# Patient Record
Sex: Male | Born: 1970 | Race: White | Hispanic: No | Marital: Married | Smoking: Former smoker
Health system: Southern US, Community
[De-identification: ages and names within clinical notes are randomized; demographics above are authoritative.]

## PROBLEM LIST (undated history)

## (undated) DIAGNOSIS — Z0389 Encounter for observation for other suspected diseases and conditions ruled out: Secondary | ICD-10-CM

## (undated) DIAGNOSIS — C439 Malignant melanoma of skin, unspecified: Secondary | ICD-10-CM

## (undated) DIAGNOSIS — E785 Hyperlipidemia, unspecified: Secondary | ICD-10-CM

## (undated) DIAGNOSIS — G479 Sleep disorder, unspecified: Secondary | ICD-10-CM

## (undated) DIAGNOSIS — R0989 Other specified symptoms and signs involving the circulatory and respiratory systems: Secondary | ICD-10-CM

## (undated) DIAGNOSIS — R0609 Other forms of dyspnea: Secondary | ICD-10-CM

## (undated) DIAGNOSIS — J342 Deviated nasal septum: Secondary | ICD-10-CM

## (undated) DIAGNOSIS — E669 Obesity, unspecified: Secondary | ICD-10-CM

## (undated) HISTORY — DX: Other specified symptoms and signs involving the circulatory and respiratory systems: R06.09

## (undated) HISTORY — DX: Sleep disorder, unspecified: G47.9

## (undated) HISTORY — DX: Encounter for observation for other suspected diseases and conditions ruled out: Z03.89

## (undated) HISTORY — DX: Hyperlipidemia, unspecified: E78.5

## (undated) HISTORY — DX: Obesity, unspecified: E66.9

## (undated) HISTORY — PX: FRACTURE SURGERY: SHX138

## (undated) HISTORY — PX: KNEE SURGERY: SHX244

## (undated) HISTORY — DX: Other specified symptoms and signs involving the circulatory and respiratory systems: R09.89

## (undated) HISTORY — PX: TONSILLECTOMY: SUR1361

---

## 1997-12-23 ENCOUNTER — Encounter: Payer: Self-pay | Admitting: Emergency Medicine

## 1997-12-23 ENCOUNTER — Emergency Department (HOSPITAL_COMMUNITY): Admission: EM | Admit: 1997-12-23 | Discharge: 1997-12-23 | Payer: Self-pay | Admitting: Emergency Medicine

## 1997-12-25 ENCOUNTER — Encounter: Admission: RE | Admit: 1997-12-25 | Discharge: 1998-03-25 | Payer: Self-pay | Admitting: *Deleted

## 2002-11-16 ENCOUNTER — Encounter: Payer: Self-pay | Admitting: Emergency Medicine

## 2002-11-16 ENCOUNTER — Emergency Department (HOSPITAL_COMMUNITY): Admission: EM | Admit: 2002-11-16 | Discharge: 2002-11-16 | Payer: Self-pay | Admitting: Emergency Medicine

## 2004-10-03 ENCOUNTER — Emergency Department: Payer: Self-pay | Admitting: Unknown Physician Specialty

## 2006-03-27 ENCOUNTER — Emergency Department: Payer: Self-pay | Admitting: Emergency Medicine

## 2008-01-21 ENCOUNTER — Emergency Department (HOSPITAL_COMMUNITY): Admission: EM | Admit: 2008-01-21 | Discharge: 2008-01-21 | Payer: Self-pay | Admitting: Emergency Medicine

## 2009-12-01 ENCOUNTER — Ambulatory Visit: Payer: Self-pay | Admitting: Diagnostic Radiology

## 2009-12-01 ENCOUNTER — Emergency Department (HOSPITAL_BASED_OUTPATIENT_CLINIC_OR_DEPARTMENT_OTHER): Admission: EM | Admit: 2009-12-01 | Discharge: 2009-12-01 | Payer: Self-pay | Admitting: Emergency Medicine

## 2010-04-08 ENCOUNTER — Emergency Department (HOSPITAL_BASED_OUTPATIENT_CLINIC_OR_DEPARTMENT_OTHER)
Admission: EM | Admit: 2010-04-08 | Discharge: 2010-04-08 | Disposition: A | Discharge status: Home / Self Care | Payer: Self-pay | Attending: Emergency Medicine | Admitting: Emergency Medicine

## 2010-04-08 DIAGNOSIS — J45909 Unspecified asthma, uncomplicated: Secondary | ICD-10-CM | POA: Insufficient documentation

## 2010-04-08 DIAGNOSIS — G8929 Other chronic pain: Secondary | ICD-10-CM | POA: Insufficient documentation

## 2010-04-08 DIAGNOSIS — J40 Bronchitis, not specified as acute or chronic: Secondary | ICD-10-CM | POA: Insufficient documentation

## 2010-04-08 DIAGNOSIS — R05 Cough: Secondary | ICD-10-CM | POA: Insufficient documentation

## 2010-04-08 DIAGNOSIS — R059 Cough, unspecified: Secondary | ICD-10-CM | POA: Insufficient documentation

## 2010-06-13 ENCOUNTER — Emergency Department (HOSPITAL_BASED_OUTPATIENT_CLINIC_OR_DEPARTMENT_OTHER)
Admission: EM | Admit: 2010-06-13 | Discharge: 2010-06-13 | Disposition: A | Discharge status: Home / Self Care | Payer: Self-pay | Attending: Emergency Medicine | Admitting: Emergency Medicine

## 2010-06-13 DIAGNOSIS — L02419 Cutaneous abscess of limb, unspecified: Secondary | ICD-10-CM | POA: Insufficient documentation

## 2010-06-13 DIAGNOSIS — M549 Dorsalgia, unspecified: Secondary | ICD-10-CM | POA: Insufficient documentation

## 2010-06-13 DIAGNOSIS — J45909 Unspecified asthma, uncomplicated: Secondary | ICD-10-CM | POA: Insufficient documentation

## 2010-06-13 DIAGNOSIS — G8929 Other chronic pain: Secondary | ICD-10-CM | POA: Insufficient documentation

## 2010-06-13 LAB — DIFFERENTIAL
Basophils Absolute: 0 10*3/uL (ref 0.0–0.1)
Basophils Relative: 0 % (ref 0–1)
Eosinophils Absolute: 0.1 10*3/uL (ref 0.0–0.7)
Eosinophils Relative: 1 % (ref 0–5)
Lymphocytes Relative: 26 % (ref 12–46)
Lymphs Abs: 2.4 10*3/uL (ref 0.7–4.0)
Monocytes Absolute: 1.3 10*3/uL — ABNORMAL HIGH (ref 0.1–1.0)
Monocytes Relative: 14 % — ABNORMAL HIGH (ref 3–12)
Neutro Abs: 5.3 10*3/uL (ref 1.7–7.7)
Neutrophils Relative %: 58 % (ref 43–77)

## 2010-06-13 LAB — GLUCOSE, CAPILLARY: Glucose-Capillary: 96 mg/dL (ref 70–99)

## 2010-06-13 LAB — CBC
HCT: 43 % (ref 39.0–52.0)
MCH: 28.7 pg (ref 26.0–34.0)
MCHC: 34.7 g/dL (ref 30.0–36.0)
RDW: 12.7 % (ref 11.5–15.5)

## 2011-03-01 HISTORY — PX: TURBINATE RESECTION: SHX6158

## 2011-03-29 ENCOUNTER — Ambulatory Visit: Payer: BC Managed Care – PPO | Admitting: Physical Medicine & Rehabilitation

## 2011-03-29 ENCOUNTER — Encounter: Payer: BC Managed Care – PPO | Attending: Physical Medicine & Rehabilitation

## 2011-04-15 ENCOUNTER — Ambulatory Visit (HOSPITAL_COMMUNITY)
Admission: RE | Admit: 2011-04-15 | Discharge: 2011-04-15 | Disposition: A | Discharge status: Home / Self Care | Payer: BC Managed Care – PPO | Source: Ambulatory Visit | Attending: Physical Medicine & Rehabilitation | Admitting: Physical Medicine & Rehabilitation

## 2011-04-15 ENCOUNTER — Other Ambulatory Visit: Payer: Self-pay | Admitting: Physical Medicine & Rehabilitation

## 2011-04-15 ENCOUNTER — Encounter: Payer: BC Managed Care – PPO | Attending: Physical Medicine & Rehabilitation

## 2011-04-15 ENCOUNTER — Ambulatory Visit (HOSPITAL_BASED_OUTPATIENT_CLINIC_OR_DEPARTMENT_OTHER): Payer: BC Managed Care – PPO | Admitting: Physical Medicine & Rehabilitation

## 2011-04-15 DIAGNOSIS — M546 Pain in thoracic spine: Secondary | ICD-10-CM

## 2011-04-15 DIAGNOSIS — M25569 Pain in unspecified knee: Secondary | ICD-10-CM | POA: Insufficient documentation

## 2011-04-15 DIAGNOSIS — M549 Dorsalgia, unspecified: Secondary | ICD-10-CM | POA: Insufficient documentation

## 2011-04-15 DIAGNOSIS — R52 Pain, unspecified: Secondary | ICD-10-CM

## 2011-04-15 DIAGNOSIS — G8921 Chronic pain due to trauma: Secondary | ICD-10-CM
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
## 2011-04-20 ENCOUNTER — Telehealth: Payer: Self-pay | Admitting: Physical Medicine & Rehabilitation

## 2011-04-20 NOTE — Telephone Encounter (Signed)
X-rays of the thoracic spine are normal X-rays of the right knee shows some evidence of a chronic ligament tear, nothing new, no joint arthritis

## 2011-04-20 NOTE — Telephone Encounter (Signed)
Pt requesting xray results

## 2011-04-20 NOTE — Telephone Encounter (Signed)
Please look at X-rays so that I may tell the patient what they show. Thanks.

## 2011-04-21 NOTE — Telephone Encounter (Signed)
Pt aware.

## 2011-04-25 ENCOUNTER — Telehealth: Payer: Self-pay | Admitting: Physical Medicine & Rehabilitation

## 2011-04-25 ENCOUNTER — Other Ambulatory Visit: Payer: Self-pay | Admitting: Physical Medicine & Rehabilitation

## 2011-04-25 MED ORDER — TRAMADOL HCL 50 MG PO TABS: 100.0000 mg | ORAL_TABLET | Freq: Three times a day (TID) | ORAL | Status: DC

## 2011-04-25 NOTE — Telephone Encounter (Signed)
Tramadol not working. Please call.

## 2011-04-25 NOTE — Telephone Encounter (Signed)
What dose would you like me to send in? He is on Tramadol 50mg  2 tablets TID

## 2011-04-25 NOTE — Telephone Encounter (Signed)
Please call in higher dose tramadol rx

## 2011-04-25 NOTE — Telephone Encounter (Signed)
Pt states that Tramadol isn't giving him any relief whatsoever. He has an appointment on 05/16/2011 with Dr. Wynn Banker but is wanting something stronger.

## 2011-04-26 NOTE — Telephone Encounter (Signed)
If the pt is already on Tramadol 100mg  TID may add tylenol 650mg  TID to this

## 2011-04-26 NOTE — Telephone Encounter (Signed)
Pt states that he is not going to do this and he will not be back to this office. He will go somewhere that is going to help him.

## 2011-05-10 ENCOUNTER — Encounter: Payer: BC Managed Care – PPO | Attending: Physical Medicine & Rehabilitation

## 2011-05-10 ENCOUNTER — Ambulatory Visit: Payer: BC Managed Care – PPO | Admitting: Physical Medicine & Rehabilitation

## 2011-05-10 DIAGNOSIS — G8921 Chronic pain due to trauma: Secondary | ICD-10-CM | POA: Insufficient documentation

## 2011-05-10 DIAGNOSIS — M25569 Pain in unspecified knee: Secondary | ICD-10-CM | POA: Insufficient documentation

## 2011-05-10 DIAGNOSIS — M546 Pain in thoracic spine: Secondary | ICD-10-CM | POA: Insufficient documentation

## 2011-05-13 ENCOUNTER — Encounter: Payer: Self-pay | Admitting: *Deleted
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
## 2011-06-16 ENCOUNTER — Emergency Department: Payer: Self-pay | Admitting: Emergency Medicine

## 2011-12-27 ENCOUNTER — Emergency Department: Payer: Self-pay | Admitting: Emergency Medicine

## 2012-02-26 ENCOUNTER — Encounter (HOSPITAL_BASED_OUTPATIENT_CLINIC_OR_DEPARTMENT_OTHER): Payer: Self-pay | Admitting: *Deleted

## 2012-02-26 ENCOUNTER — Emergency Department (HOSPITAL_BASED_OUTPATIENT_CLINIC_OR_DEPARTMENT_OTHER)
Admission: EM | Admit: 2012-02-26 | Discharge: 2012-02-27 | Disposition: A | Discharge status: Home / Self Care | Payer: BC Managed Care – PPO | Attending: Emergency Medicine | Admitting: Emergency Medicine

## 2012-02-26 ENCOUNTER — Emergency Department (HOSPITAL_BASED_OUTPATIENT_CLINIC_OR_DEPARTMENT_OTHER): Payer: BC Managed Care – PPO

## 2012-02-26 DIAGNOSIS — S8990XA Unspecified injury of unspecified lower leg, initial encounter: Secondary | ICD-10-CM | POA: Insufficient documentation

## 2012-02-26 DIAGNOSIS — W108XXA Fall (on) (from) other stairs and steps, initial encounter: Secondary | ICD-10-CM | POA: Insufficient documentation

## 2012-02-26 DIAGNOSIS — Z9889 Other specified postprocedural states: Secondary | ICD-10-CM | POA: Insufficient documentation

## 2012-02-26 DIAGNOSIS — Y9389 Activity, other specified: Secondary | ICD-10-CM | POA: Insufficient documentation

## 2012-02-26 DIAGNOSIS — R269 Unspecified abnormalities of gait and mobility: Secondary | ICD-10-CM | POA: Insufficient documentation

## 2012-02-26 DIAGNOSIS — Y929 Unspecified place or not applicable: Secondary | ICD-10-CM | POA: Insufficient documentation

## 2012-02-26 DIAGNOSIS — M25569 Pain in unspecified knee: Secondary | ICD-10-CM

## 2012-02-26 MED ORDER — OXYCODONE-ACETAMINOPHEN 5-325 MG PO TABS
2.0000 | ORAL_TABLET | Freq: Once | ORAL | Status: AC
  Administered 2012-02-26: 2 via ORAL
  Filled 2012-02-26 (×2): qty 2

## 2012-02-26 MED ORDER — OXYCODONE-ACETAMINOPHEN 5-325 MG PO TABS: 1.0000 | ORAL_TABLET | ORAL | Status: DC | PRN

## 2012-02-26 MED ORDER — IBUPROFEN 800 MG PO TABS: 800.0000 mg | ORAL_TABLET | Freq: Three times a day (TID) | ORAL | Status: DC

## 2012-02-26 NOTE — ED Provider Notes (Signed)
History     CSN: [id number]  Arrival date & time 02/26/12  2216   First MD Initiated Contact with Patient 02/26/12 2233      Chief Complaint  Patient presents with  . Knee Pain    (Consider location/radiation/quality/duration/timing/severity/associated sxs/prior treatment) Patient is a 41 y.o. male presenting with knee pain. The history is provided by the patient. No language interpreter was used.  Knee Pain This is a new problem. The current episode started today. The problem occurs constantly. Pertinent negatives include no fever, joint swelling or vomiting. Associated symptoms comments: Right knee pain. The symptoms are aggravated by walking. He has tried nothing for the symptoms.  41yo male with R knee pain and R lower extremity pain after a fall down 8 stairs.  Prior R knee surgery. Walking with a limp.  No deformity or swelling noted.    History reviewed. No pertinent past medical history.  Past Surgical History  Procedure Date  . Knee surgery   . Fracture surgery   . Tonsillectomy     History reviewed. No pertinent family history.  History  Substance Use Topics  . Smoking status: Never Smoker   . Smokeless tobacco: Not on file  . Alcohol Use: No      Review of Systems  Constitutional: Negative.  Negative for fever.  HENT: Negative.   Eyes: Negative.   Respiratory: Negative.   Cardiovascular: Negative.   Gastrointestinal: Negative.  Negative for vomiting.  Musculoskeletal: Positive for gait problem. Negative for joint swelling.       Right knee pain Distal tib fib pain.  Neurological: Negative.   Psychiatric/Behavioral: Negative.   All other systems reviewed and are negative.    Allergies  Vicodin  Home Medications   Current Outpatient Rx  Name  Route  Sig  Dispense  Refill  . DICLOFENAC SODIUM 1 % TD GEL   Topical   Apply topically 4 (four) times daily.         . TRAMADOL HCL 50 MG PO TABS   Oral   Take 2 tablets (100 mg total) by mouth 3  (three) times daily.   180 tablet   1     BP 149/90  Pulse 80  Temp 97.8 F (36.6 C) (Oral)  Resp 20  Ht 6\' 2"  (1.88 m)  Wt 285 lb (129.275 kg)  BMI 36.59 kg/m2  SpO2 97%  Physical Exam  Nursing note and vitals reviewed. Constitutional: He is oriented to person, place, and time. He appears well-developed and well-nourished.  HENT:  Head: Normocephalic.  Eyes: Conjunctivae normal and EOM are normal. Pupils are equal, round, and reactive to light.  Neck: Normal range of motion. Neck supple.  Cardiovascular: Normal rate.   Pulmonary/Chest: Effort normal.  Abdominal: Soft.  Musculoskeletal: Normal range of motion. He exhibits tenderness. He exhibits no edema.       Tenderness to the right knee/ patella. Pain with inversion of the right foot to the left lateral knee. Positive CMS below injury. Para spinal lumbar pain with no bruising  Neurological: He is alert and oriented to person, place, and time.  Skin: Skin is warm and dry.  Psychiatric: He has a normal mood and affect.    ED Course  Procedures (including critical care time)  Labs Reviewed - No data to display No results found.   No diagnosis found.    MDM  Fall tonight with R knee R LE pain.  X-ray unremarkable for fracture reviewed by myself.  Knee immobilizer and crutches provided.  He will follow up with ortho.  Ibuprofen/ percocet RICE.  + CMS below injury.         Remi Haggard, NP 02/27/12 1154

## 2012-02-26 NOTE — ED Notes (Signed)
Pt states he was walking down hardwood steps with wet shoes and fell down about 8 of them. Now c/o pain to right knee, lower leg and back. Hx surgery right knee.

## 2012-02-27 NOTE — Progress Notes (Signed)
Two calls made to patients home number - no answer.  I called cell phone- mail box is full.  I call home number again and left information on voice mail- NPO after midnight, call [phone] at 0800 for arrival time, vallet parking and meds that he can take with a sip of water- Tylox or Ultram.

## 2012-02-28 ENCOUNTER — Encounter (HOSPITAL_COMMUNITY): Payer: Self-pay | Admitting: Anesthesiology

## 2012-02-28 ENCOUNTER — Ambulatory Visit (HOSPITAL_COMMUNITY): Payer: BC Managed Care – PPO | Admitting: Anesthesiology

## 2012-02-28 ENCOUNTER — Other Ambulatory Visit: Payer: Self-pay | Admitting: Otolaryngology

## 2012-02-28 ENCOUNTER — Encounter (HOSPITAL_COMMUNITY)
Admission: RE | Disposition: A | Discharge status: Home / Self Care | Payer: Self-pay | Source: Ambulatory Visit | Attending: Otolaryngology

## 2012-02-28 ENCOUNTER — Encounter (HOSPITAL_COMMUNITY): Payer: Self-pay | Admitting: *Deleted

## 2012-02-28 ENCOUNTER — Ambulatory Visit (HOSPITAL_COMMUNITY)
Admission: RE | Admit: 2012-02-28 | Discharge: 2012-03-01 | Disposition: A | Discharge status: Home / Self Care | Payer: BC Managed Care – PPO | Source: Ambulatory Visit | Attending: Otolaryngology | Admitting: Otolaryngology

## 2012-02-28 DIAGNOSIS — Z9889 Other specified postprocedural states: Secondary | ICD-10-CM

## 2012-02-28 DIAGNOSIS — J342 Deviated nasal septum: Secondary | ICD-10-CM | POA: Insufficient documentation

## 2012-02-28 DIAGNOSIS — J343 Hypertrophy of nasal turbinates: Secondary | ICD-10-CM | POA: Insufficient documentation

## 2012-02-28 DIAGNOSIS — G4733 Obstructive sleep apnea (adult) (pediatric): Secondary | ICD-10-CM | POA: Insufficient documentation

## 2012-02-28 DIAGNOSIS — E669 Obesity, unspecified: Secondary | ICD-10-CM | POA: Insufficient documentation

## 2012-02-28 DIAGNOSIS — R112 Nausea with vomiting, unspecified: Secondary | ICD-10-CM | POA: Insufficient documentation

## 2012-02-28 DIAGNOSIS — J352 Hypertrophy of adenoids: Secondary | ICD-10-CM | POA: Insufficient documentation

## 2012-02-28 HISTORY — PX: ADENOIDECTOMY: SHX5191

## 2012-02-28 HISTORY — DX: Deviated nasal septum: J34.2

## 2012-02-28 HISTORY — PX: NASAL SEPTOPLASTY W/ TURBINOPLASTY: SHX2070

## 2012-02-28 LAB — CBC
HCT: 43.8 % (ref 39.0–52.0)
Hemoglobin: 14.6 g/dL (ref 13.0–17.0)
MCH: 28.6 pg (ref 26.0–34.0)
MCHC: 33.3 g/dL (ref 30.0–36.0)
MCV: 85.7 fL (ref 78.0–100.0)

## 2012-02-28 LAB — SURGICAL PCR SCREEN: MRSA, PCR: NEGATIVE

## 2012-02-28 SURGERY — SEPTOPLASTY, NOSE, WITH NASAL TURBINATE REDUCTION
Anesthesia: General | Site: Throat | Wound class: Dirty or Infected

## 2012-02-28 MED ORDER — MEPERIDINE HCL 25 MG/ML IJ SOLN: 6.2500 mg | INTRAMUSCULAR | Status: DC | PRN

## 2012-02-28 MED ORDER — MUPIROCIN 2 % EX OINT
TOPICAL_OINTMENT | CUTANEOUS | Status: AC
  Filled 2012-02-28: qty 22

## 2012-02-28 MED ORDER — BACITRACIN ZINC 500 UNIT/GM EX OINT
TOPICAL_OINTMENT | CUTANEOUS | Status: DC | PRN
  Administered 2012-02-28: 1 via TOPICAL

## 2012-02-28 MED ORDER — OXYCODONE HCL 5 MG PO TABS: 5.0000 mg | ORAL_TABLET | Freq: Once | ORAL | Status: DC | PRN

## 2012-02-28 MED ORDER — PROPOFOL 10 MG/ML IV BOLUS
INTRAVENOUS | Status: DC | PRN
  Administered 2012-02-28: 270 mg via INTRAVENOUS

## 2012-02-28 MED ORDER — HYDROMORPHONE HCL PF 1 MG/ML IJ SOLN
0.2500 mg | INTRAMUSCULAR | Status: DC | PRN
  Administered 2012-02-28 (×2): 0.5 mg via INTRAVENOUS

## 2012-02-28 MED ORDER — OXYCODONE HCL 5 MG/5ML PO SOLN
5.0000 mg | Freq: Once | ORAL | Status: DC | PRN
Start: 2012-02-28 — End: 2012-02-28

## 2012-02-28 MED ORDER — MIDAZOLAM HCL 2 MG/2ML IJ SOLN: 0.5000 mg | Freq: Once | INTRAMUSCULAR | Status: DC | PRN

## 2012-02-28 MED ORDER — LACTATED RINGERS IV SOLN
INTRAVENOUS | Status: DC | PRN
  Administered 2012-02-28 (×2): via INTRAVENOUS

## 2012-02-28 MED ORDER — OXYCODONE-ACETAMINOPHEN 5-325 MG PO TABS
1.0000 | ORAL_TABLET | ORAL | Status: DC | PRN
  Administered 2012-02-29 – 2012-03-01 (×3): 2 via ORAL
  Filled 2012-02-28 (×3): qty 2

## 2012-02-28 MED ORDER — OXYMETAZOLINE HCL 0.05 % NA SOLN
NASAL | Status: DC | PRN
  Administered 2012-02-28: 2 via NASAL
  Administered 2012-02-28: 1

## 2012-02-28 MED ORDER — ARTIFICIAL TEARS OP OINT
TOPICAL_OINTMENT | OPHTHALMIC | Status: DC | PRN
  Administered 2012-02-28: 1 via OPHTHALMIC

## 2012-02-28 MED ORDER — CEFAZOLIN SODIUM-DEXTROSE 2-3 GM-% IV SOLR
INTRAVENOUS | Status: AC
  Filled 2012-02-28: qty 50

## 2012-02-28 MED ORDER — CEFAZOLIN SODIUM 1-5 GM-% IV SOLN
1.0000 g | Freq: Three times a day (TID) | INTRAVENOUS | Status: AC
  Administered 2012-02-29 (×3): 1 g via INTRAVENOUS
  Filled 2012-02-28 (×3): qty 50

## 2012-02-28 MED ORDER — DEXAMETHASONE SODIUM PHOSPHATE 4 MG/ML IJ SOLN
INTRAMUSCULAR | Status: DC | PRN
  Administered 2012-02-28: 20 mg via INTRAVENOUS

## 2012-02-28 MED ORDER — MUPIROCIN 2 % EX OINT
TOPICAL_OINTMENT | Freq: Two times a day (BID) | CUTANEOUS | Status: DC
  Filled 2012-02-28: qty 22

## 2012-02-28 MED ORDER — MIDAZOLAM HCL 5 MG/5ML IJ SOLN
INTRAMUSCULAR | Status: DC | PRN
  Administered 2012-02-28: 2 mg via INTRAVENOUS

## 2012-02-28 MED ORDER — ROCURONIUM BROMIDE 100 MG/10ML IV SOLN
INTRAVENOUS | Status: DC | PRN
  Administered 2012-02-28: 50 mg via INTRAVENOUS

## 2012-02-28 MED ORDER — PHENOL 1.4 % MT LIQD
1.0000 | OROMUCOSAL | Status: DC | PRN
  Filled 2012-02-28: qty 177

## 2012-02-28 MED ORDER — 0.9 % SODIUM CHLORIDE (POUR BTL) OPTIME
TOPICAL | Status: DC | PRN
  Administered 2012-02-28: 1000 mL

## 2012-02-28 MED ORDER — PROMETHAZINE HCL 25 MG/ML IJ SOLN: 6.2500 mg | INTRAMUSCULAR | Status: DC | PRN

## 2012-02-28 MED ORDER — HYDROMORPHONE HCL PF 1 MG/ML IJ SOLN
INTRAMUSCULAR | Status: AC
  Filled 2012-02-28: qty 1

## 2012-02-28 MED ORDER — PROMETHAZINE HCL 25 MG PO TABS: 25.0000 mg | ORAL_TABLET | Freq: Four times a day (QID) | ORAL | Status: DC | PRN

## 2012-02-28 MED ORDER — ZOLPIDEM TARTRATE 5 MG PO TABS: 5.0000 mg | ORAL_TABLET | Freq: Every evening | ORAL | Status: DC | PRN

## 2012-02-28 MED ORDER — ONDANSETRON HCL 4 MG/2ML IJ SOLN
INTRAMUSCULAR | Status: DC | PRN
  Administered 2012-02-28: 4 mg via INTRAVENOUS

## 2012-02-28 MED ORDER — GLYCOPYRROLATE 0.2 MG/ML IJ SOLN
INTRAMUSCULAR | Status: DC | PRN
  Administered 2012-02-28: .6 mg via INTRAVENOUS

## 2012-02-28 MED ORDER — MORPHINE SULFATE 2 MG/ML IJ SOLN
2.0000 mg | INTRAMUSCULAR | Status: DC | PRN
  Administered 2012-02-28 – 2012-02-29 (×7): 4 mg via INTRAVENOUS
  Filled 2012-02-28: qty 2
  Filled 2012-02-28: qty 1
  Filled 2012-02-28 (×6): qty 2

## 2012-02-28 MED ORDER — CEFAZOLIN SODIUM-DEXTROSE 2-3 GM-% IV SOLR
INTRAVENOUS | Status: DC | PRN
  Administered 2012-02-28: 2 g via INTRAVENOUS

## 2012-02-28 MED ORDER — NEOSTIGMINE METHYLSULFATE 1 MG/ML IJ SOLN
INTRAMUSCULAR | Status: DC | PRN
  Administered 2012-02-28: 4 mg via INTRAVENOUS

## 2012-02-28 MED ORDER — LIDOCAINE-EPINEPHRINE 1 %-1:100000 IJ SOLN
INTRAMUSCULAR | Status: DC | PRN
  Administered 2012-02-28: 4 mL

## 2012-02-28 MED ORDER — PROMETHAZINE HCL 25 MG RE SUPP
25.0000 mg | Freq: Four times a day (QID) | RECTAL | Status: DC | PRN
  Administered 2012-02-29: 25 mg via RECTAL
  Filled 2012-02-28: qty 1

## 2012-02-28 MED ORDER — KCL IN DEXTROSE-NACL 20-5-0.45 MEQ/L-%-% IV SOLN
INTRAVENOUS | Status: DC
  Administered 2012-02-28 – 2012-02-29 (×3): via INTRAVENOUS
  Filled 2012-02-28 (×4): qty 1000

## 2012-02-28 MED ORDER — BACITRACIN ZINC 500 UNIT/GM EX OINT
TOPICAL_OINTMENT | CUTANEOUS | Status: AC
  Filled 2012-02-28: qty 15

## 2012-02-28 MED ORDER — LIDOCAINE-EPINEPHRINE 1 %-1:100000 IJ SOLN
INTRAMUSCULAR | Status: AC
  Filled 2012-02-28: qty 1

## 2012-02-28 MED ORDER — OXYMETAZOLINE HCL 0.05 % NA SOLN
NASAL | Status: AC
  Filled 2012-02-28: qty 30

## 2012-02-28 MED ORDER — LACTATED RINGERS IV SOLN
INTRAVENOUS | Status: DC
  Administered 2012-02-28: 17:00:00 via INTRAVENOUS

## 2012-02-28 MED ORDER — SUCCINYLCHOLINE CHLORIDE 20 MG/ML IJ SOLN
INTRAMUSCULAR | Status: DC | PRN
  Administered 2012-02-28: 120 mg via INTRAVENOUS

## 2012-02-28 MED ORDER — LIDOCAINE HCL (CARDIAC) 20 MG/ML IV SOLN
INTRAVENOUS | Status: DC | PRN
  Administered 2012-02-28: 100 mg via INTRAVENOUS

## 2012-02-28 MED ORDER — FENTANYL CITRATE 0.05 MG/ML IJ SOLN
INTRAMUSCULAR | Status: DC | PRN
  Administered 2012-02-28 (×5): 50 ug via INTRAVENOUS

## 2012-02-28 SURGICAL SUPPLY — 45 items
CANISTER SUCTION 1500CC (MISCELLANEOUS) ×3 IMPLANT
CANISTER SUCTION 2500CC (MISCELLANEOUS) ×3 IMPLANT
CATH ROBINSON RED A/P 10FR (CATHETERS) ×3 IMPLANT
CLEANER TIP ELECTROSURG 2X2 (MISCELLANEOUS) ×1 IMPLANT
CLOTH BEACON ORANGE TIMEOUT ST (SAFETY) ×3 IMPLANT
COAGULATOR SUCT SWTCH 10FR 6 (ELECTROSURGICAL) ×3 IMPLANT
DECANTER SPIKE VIAL GLASS SM (MISCELLANEOUS) ×3 IMPLANT
DRAPE CAMERA CLOSED 9X96 (DRAPES) IMPLANT
ELECT COATED BLADE 2.86 ST (ELECTRODE) IMPLANT
ELECT REM PT RETURN 9FT ADLT (ELECTROSURGICAL) ×3
ELECT REM PT RETURN 9FT PED (ELECTROSURGICAL)
ELECTRODE REM PT RETRN 9FT PED (ELECTROSURGICAL) IMPLANT
ELECTRODE REM PT RTRN 9FT ADLT (ELECTROSURGICAL) IMPLANT
GAUZE SPONGE 2X2 8PLY STRL LF (GAUZE/BANDAGES/DRESSINGS) ×2 IMPLANT
GAUZE SPONGE 4X4 16PLY XRAY LF (GAUZE/BANDAGES/DRESSINGS) ×3 IMPLANT
GLOVE ECLIPSE 7.5 STRL STRAW (GLOVE) ×3 IMPLANT
GOWN STRL NON-REIN LRG LVL3 (GOWN DISPOSABLE) ×6 IMPLANT
KIT BASIN OR (CUSTOM PROCEDURE TRAY) ×3 IMPLANT
KIT ROOM TURNOVER OR (KITS) ×3 IMPLANT
NS IRRIG 1000ML POUR BTL (IV SOLUTION) ×3 IMPLANT
PACK SURGICAL SETUP 50X90 (CUSTOM PROCEDURE TRAY) ×3 IMPLANT
PAD ARMBOARD 7.5X6 YLW CONV (MISCELLANEOUS) ×6 IMPLANT
PENCIL FOOT CONTROL (ELECTRODE) IMPLANT
SPECIMEN JAR SMALL (MISCELLANEOUS) ×1 IMPLANT
SPLINT NASAL DOYLE BI-VL (GAUZE/BANDAGES/DRESSINGS) ×3 IMPLANT
SPONGE GAUZE 2X2 STER 10/PKG (GAUZE/BANDAGES/DRESSINGS)
SPONGE NEURO XRAY DETECT 1X3 (DISPOSABLE) ×3 IMPLANT
SPONGE TONSIL 1 RF SGL (DISPOSABLE) ×4 IMPLANT
SUT CHROMIC 3 0 SH 27 (SUTURE) ×1 IMPLANT
SUT CHROMIC 4 0 S 4 (SUTURE) IMPLANT
SUT PLAIN 4 0 ~~LOC~~ 1 (SUTURE) ×3 IMPLANT
SUT PROLENE 2 0 FS (SUTURE) IMPLANT
SUT VIC AB 3-0 SH 27 (SUTURE)
SUT VIC AB 3-0 SH 27X BRD (SUTURE) IMPLANT
SYR BULB 3OZ (MISCELLANEOUS) ×3 IMPLANT
TOWEL OR 17X24 6PK STRL BLUE (TOWEL DISPOSABLE) ×3 IMPLANT
TOWEL OR 17X26 10 PK STRL BLUE (TOWEL DISPOSABLE) ×3 IMPLANT
TRAY ENT MC OR (CUSTOM PROCEDURE TRAY) ×3 IMPLANT
TUBE CONNECTING 12X1/4 (SUCTIONS) ×3 IMPLANT
TUBE SALEM SUMP 10F W/ARV (TUBING) IMPLANT
TUBE SALEM SUMP 12R W/ARV (TUBING) IMPLANT
TUBE SALEM SUMP 14F W/ARV (TUBING) IMPLANT
TUBE SALEM SUMP 16 FR W/ARV (TUBING) IMPLANT
TUBING EXTENTION W/L.L. (IV SETS) IMPLANT
WATER STERILE IRR 1000ML POUR (IV SOLUTION) ×3 IMPLANT

## 2012-02-28 NOTE — Brief Op Note (Signed)
02/28/2012  7:04 PM  PATIENT:  Richard Byrd  41 y.o. male  PRE-OPERATIVE DIAGNOSIS:  DEVIATED SEPTUM, BILATERAL TURBINATE HYPERTROPHY, AND ADENOID HYPTERTROPHY   POST-OPERATIVE DIAGNOSIS:  DEVIATED SEPTUM, BILATERAL TURBINATE HYPERTROPHY, AND ADENOID HYPTERTROPHY  PROCEDURE:  Procedure(s) (LRB) with comments: 1) NASAL SEPTOPLASTY  2) Bilateral partial inferior TURBINATE resection 3) ADENOIDECTOMY   SURGEON:  Surgeon(s) and Role:    * Darletta Moll, MD - Primary  PHYSICIAN ASSISTANT:   ASSISTANTS: none   ANESTHESIA:   general  EBL:     BLOOD ADMINISTERED:none  DRAINS: none   LOCAL MEDICATIONS USED:  LIDOCAINE   SPECIMEN:  Source of Specimen:  adenoid  DISPOSITION OF SPECIMEN:  PATHOLOGY  COUNTS:  YES  TOURNIQUET:  * No tourniquets in log *  DICTATION: .Other Dictation: Dictation Number  [id number]  PLAN OF CARE: Admit for overnight observation  PATIENT DISPOSITION:  PACU - hemodynamically stable.   Delay start of Pharmacological VTE agent (>24hrs) due to surgical blood loss or risk of bleeding: no

## 2012-02-28 NOTE — ED Provider Notes (Signed)
Medical screening examination/treatment/procedure(s) were performed by non-physician practitioner and as supervising physician I was immediately available for consultation/collaboration.  Perlie Scheuring, MD 02/28/12 0953 

## 2012-02-28 NOTE — Anesthesia Procedure Notes (Addendum)
Procedure Name: Intubation Performed by: Edmonia Caprio   Procedure Name: Intubation Date/Time: 02/28/2012 5:19 PM Performed by: Edmonia Caprio Pre-anesthesia Checklist: Patient identified, Emergency Drugs available, Suction available, Patient being monitored and Timeout performed Patient Re-evaluated:Patient Re-evaluated prior to inductionOxygen Delivery Method: Circle system utilized Preoxygenation: Pre-oxygenation with 100% oxygen Intubation Type: IV induction and Rapid sequence Ventilation: Mask ventilation without difficulty Grade View: Grade III Tube type: Oral Tube size: 7.5 mm Number of attempts: 2 Airway Equipment and Method: Patient positioned with wedge pillow,  Video-laryngoscopy and Stylet Placement Confirmation: ETT inserted through vocal cords under direct vision,  breath sounds checked- equal and bilateral and positive ETCO2 Secured at: 22 cm Dental Injury: Teeth and Oropharynx as per pre-operative assessment  Difficulty Due To: Difficulty was anticipated and Difficult Airway- due to anterior larynx

## 2012-02-28 NOTE — Preoperative (Signed)
Beta Blockers   Reason not to administer Beta Blockers:Not Applicable 

## 2012-02-28 NOTE — Transfer of Care (Signed)
Immediate Anesthesia Transfer of Care Note  Patient: Richard Byrd  Procedure(s) Performed: Procedure(s) (LRB) with comments: NASAL SEPTOPLASTY WITH TURBINATE REDUCTION (Bilateral) - SEPTOPLASTY AND BILATERAL TURBINATE RESECTION   ADENOIDECTOMY (N/A)  Patient Location: PACU  Anesthesia Type:General  Level of Consciousness: awake and alert   Airway & Oxygen Therapy: Patient Spontanous Breathing and Patient connected to face mask  Post-op Assessment: Report given to PACU RN  Post vital signs: Reviewed  Complications: No apparent anesthesia complications
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
## 2012-02-28 NOTE — Anesthesia Postprocedure Evaluation (Signed)
  Anesthesia Post-op Note  Patient: Richard Byrd  Procedure(s) Performed: Procedure(s) (LRB) with comments: NASAL SEPTOPLASTY WITH TURBINATE REDUCTION (Bilateral) - SEPTOPLASTY AND BILATERAL TURBINATE RESECTION   ADENOIDECTOMY (N/A)  Patient Location: PACU  Anesthesia Type:General  Level of Consciousness: awake, alert , oriented and patient cooperative  Airway and Oxygen Therapy: Patient Spontanous Breathing  Post-op Pain: mild  Post-op Assessment: Post-op Vital signs reviewed, Patient's Cardiovascular Status Stable, Respiratory Function Stable, Patent Airway, No signs of Nausea or vomiting and Pain level controlled  Post-op Vital Signs: Reviewed and stable  Complications: No apparent anesthesia complications

## 2012-02-28 NOTE — Anesthesia Preprocedure Evaluation (Addendum)
Anesthesia Evaluation  Patient identified by MRN, date of birth, ID band Patient awake    Reviewed: Allergy & Precautions, H&P , Patient's Chart, lab work & pertinent test results  Airway Mallampati: III TM Distance: >3 FB Neck ROM: Full    Dental  (+) Dental Advisory Given   Pulmonary sleep apnea (no sleep study but patient thinks he has sleep apnea +snoring) ,  breath sounds clear to auscultation        Cardiovascular negative cardio ROS  Rhythm:Regular Rate:Normal     Neuro/Psych negative neurological ROS     GI/Hepatic negative GI ROS, Neg liver ROS,   Endo/Other  negative endocrine ROS  Renal/GU negative Renal ROS  negative genitourinary   Musculoskeletal negative musculoskeletal ROS (+)   Abdominal (+) + obese,   Peds  Hematology negative hematology ROS (+)   Anesthesia Other Findings Patient with ER visit 2 days ago s/p Larey Seat and hurt his knee.  On Ibuprofen 800 mg, stopped 24 hours ago prior to surgery.  Knee feeling better now.  Reproductive/Obstetrics negative OB ROS                          Anesthesia Physical Anesthesia Plan  ASA: II  Anesthesia Plan: General   Post-op Pain Management:    Induction: Intravenous  Airway Management Planned: Oral ETT  Additional Equipment:   Intra-op Plan:   Post-operative Plan: Extubation in OR  Informed Consent: I have reviewed the patients History and Physical, chart, labs and discussed the procedure including the risks, benefits and alternatives for the proposed anesthesia with the patient or authorized representative who has indicated his/her understanding and acceptance.   Dental advisory given  Plan Discussed with: CRNA and Surgeon  Anesthesia Plan Comments:         Anesthesia Quick Evaluation

## 2012-02-29 ENCOUNTER — Encounter (HOSPITAL_COMMUNITY): Payer: Self-pay | Admitting: General Practice

## 2012-02-29 MED ORDER — INFLUENZA VIRUS VACC SPLIT PF IM SUSP
0.5000 mL | INTRAMUSCULAR | Status: AC
  Administered 2012-03-01: 0.5 mL via INTRAMUSCULAR
  Filled 2012-02-29: qty 0.5

## 2012-02-29 NOTE — Op Note (Signed)
NAMEDMARI, SCHUBRING              ACCOUNT NO.:  [id number]  MEDICAL RECORD NO.:  [id number]  LOCATION:  6N28C                        FACILITY:  [facility]  PHYSICIAN:  Newman Pies, MD            DATE OF BIRTH:  Oct 27, 1970  DATE OF PROCEDURE:  02/28/2012 DATE OF DISCHARGE:                              OPERATIVE REPORT   SURGEON:  Newman Pies, MD  PREOPERATIVE DIAGNOSES: 1. Bidirectional nasal septal deviation. 2. Bilateral inferior turbinate hypertrophy. 3. Severe adenoid hypertrophy.  POSTOPERATIVE DIAGNOSES: 1. Bidirectional nasal septal deviation. 2. Bilateral inferior turbinate hypertrophy. 3. Severe adenoid hypertrophy.  PROCEDURES PERFORMED: 1. Nasal septoplasty. 2. Bilateral partial inferior turbinate resection. 3. Adenoidectomy.  ANESTHESIA:  General endotracheal tube anesthesia.  COMPLICATIONS:  None.  ESTIMATED BLOOD LOSS:  Less than 100 mL.  INDICATIONS FOR PROCEDURE:  The patient is a 42 year old male with a long history of chronic nasal obstruction.  He was previously noted to have significant nasal mucosal congestion with bidirectional nasal septal deviation and inferior turbinate hypertrophy.  In addition, he was also noted to have severe adenoid hypertrophy on nasal endoscopy examination.  He was previously treated medically with steroid nasal sprays, decongestant, antihistamine, and over-the-counter medications. However, he continues to be symptomatic.  Over the past year, the symptoms have progressively worsened.  Based on the above findings, the decision was made for the patient to undergo the above-stated procedures.  The risks, benefits, alternatives, and details of the procedures were discussed with the patient.  Questions were invited and answered.  Informed consent was obtained.  DESCRIPTION:  The patient was taken to the operating room and placed supine on the operating table.  General endotracheal tube anesthesia was administered by the anesthesiologist.   The patient was positioned and prepped and draped in a standard fashion for adenoidectomy.  Preop IV antibiotics and Decadron were given.  The Crowe-Davis mouth gag was inserted into the oral cavity for exposure.  No significant tonsillar tissue was noted.  However, the patient was noted to have a severely enlarged uvula.  Red rubber catheter was inserted via the left nostril and was used to gently retract the soft palate.  However, due to the large size of the uvula, the adenoid bed could not be fully visualized. As a result, the tip of the uvula was amputated in order to provide access to the adenoid bed.  The patient was noted to have a severely enlarged adenoids.  The adenoid was then removed with an electric cut adenotome.  The adenoid specimen was sent to the Pathology Department for histologic identification.  Hemostasis of the adenoid bed was achieved with suction electrocautery.  The red rubber catheter and mouth gag were then removed.  The patient was then repositioned and prepped and draped in a standard fashion for nasal surgery.  Pledgets soaked with Afrin were placed in both nasal cavities for vasoconstriction.  The pledgets were subsequently removed.  A 1% lidocaine with 1:100,000 epinephrine was injected onto the nasal septum bilaterally.  Examination of the nasal cavity reveals bidirectional nasal septal deviation, obstructing more than 90% of the nasal cavity.  A standard hemitransfixion incision was made on the left  side.  The submucosal flap was elevated on the left side in a standard fashion.  A cartilaginous incision was made 1 cm superior to the caudal margin of the nasal septum.  The contralateral mucosal flap was also elevated in a standard fashion.  The deviated portion of the cartilage and bony septum were removed.  The cartilage was morselized and replaced.  The septum was then quilted with 4-0 plain gut sutures.  The hemitransfixion incision was closed with  interrupted 4- 0 chromic sutures.  Attention was then focused on the inferior turbinates.  The patient was noted to have a severely hypertrophied inferior turbinates.  The inferior one-half of each inferior turbinate was then cross clamped with a pair of straight Kelly clamp.  The inferior one-half of each inferior turbinate was then resected with a pair of cross cutting scissors. Hemostasis was achieved with suction electrocautery.  That concluded procedure for the patient.  The care of the patient was turned over to the anesthesiologist.  The patient was awakened from anesthesia without difficulty.  He was extubated and transferred to the recovery room in good condition.  OPERATIVE FINDINGS: 1. Severe adenoid hypertrophy. 2. Bidirectional nasal septal deviation. 3. Bilateral inferior turbinate hypertrophy.  SPECIMEN:  Adenoid tissue.  FOLLOWUP CARE:  The patient will be admitted for overnight observation. He will most likely be discharged home on postop day #1.     Newman Pies, MD     ST/MEDQ  D:  02/28/2012  T:  02/29/2012  Job:  [id number]

## 2012-02-29 NOTE — Progress Notes (Signed)
Subjective: Pt reports significant nausea.  Vomited earlier this PM.  Does not feel like he is ready to go home. No significant bleeding.  Tolerated some liquid.  Objective: Vital signs in last 24 hours: Temp:  [97.2 F (36.2 C)-98.4 F (36.9 C)] 98.4 F (36.9 C) (01/01 1822) Pulse Rate:  [63-85] 63  (01/01 1822) Resp:  [15-19] 18  (01/01 1822) BP: (118-159)/(63-92) 140/85 mmHg (01/01 1822) SpO2:  [93 %-100 %] 100 % (01/01 1822) Weight:  [137.485 kg (303 lb 1.6 oz)] 137.485 kg (303 lb 1.6 oz) (12/31 2031)  Nasal splints in place.   No bleeding. OP: Mucosal edema. No bleeding. No stridor.   Basename 02/28/12 1511  WBC 5.2  HGB 14.6  HCT 43.8  PLT 193   No results found for this basename: NA:2,K:2,CL:2,CO2:2,GLUCOSE:2,BUN:2,CREATININE:2,CALCIUM:2 in the last 72 hours  Medications:  I have reviewed the patient's current medications. Scheduled:   . influenza  inactive virus vaccine  0.5 mL Intramuscular Tomorrow-1000   UJW:[id number] injection, oxyCODONE-acetaminophen, phenol, promethazine, promethazine, zolpidem  Assessment/Plan: Pt reports significant nausea and discomfort POD#1 s/p septoplasty, turbinate reduction, and adenoidectomy. Pt is obese with obstructive sleep apnea. - Will keep pt for one more night.  - Pain and nausea control prn. - SCDs   LOS: 1 day   Labresha Mellor,SUI W 02/29/2012, 7:54 PM

## 2012-03-01 ENCOUNTER — Encounter (HOSPITAL_COMMUNITY): Payer: Self-pay | Admitting: Otolaryngology

## 2012-03-01 MED ORDER — PROMETHAZINE HCL 25 MG PO TABS: 25.0000 mg | ORAL_TABLET | Freq: Four times a day (QID) | ORAL | Status: DC | PRN

## 2012-03-01 MED ORDER — OXYCODONE-ACETAMINOPHEN 5-325 MG PO TABS: 1.0000 | ORAL_TABLET | ORAL | Status: DC | PRN

## 2012-03-01 NOTE — Discharge Summary (Signed)
Physician Discharge Summary  Patient ID: Richard Byrd MRN: [id number] DOB/AGE: 1970-08-24 42 y.o.  Admit date: 02/28/2012 Discharge date: 03/01/2012  Admission Diagnoses: Nasal septal deviation, bilateral inferior turbinate hypertrophy, adenoid hypertrophy, obstructive sleep apnea, obesity.  Discharge Diagnoses: Nasal septal deviation, bilateral inferior turbinate hypertrophy, adenoid hypertrophy, obstructive sleep apnea, obesity. Active Problems:  * No active hospital problems. *    Discharged Condition: good  Hospital Course: The patient complains of significant nausea and vomiting on postop day #1. He was treated with Phenergan when necessary. His pain was well-controlled with Percocet and morphine. No significant bleeding was noted after the surgery. He was discharged home on postop day #2 in good condition.  Consults: None  Significant Diagnostic Studies: None  Treatments: surgery: Septoplasty, bilateral partial inferior turbinate resection, adenoidectomy  Discharge Exam: Blood pressure 106/58, pulse 77, temperature 97.6 F (36.4 C), temperature source Oral, resp. rate 18, height 6\' 2"  (1.88 m), weight 137.485 kg (303 lb 1.6 oz), SpO2 97.00%. The patient was alert and oriented x3. No significant bleeding was noted from his nasal cavity or the oropharynx. No stridor.  Disposition: 01-Home or Self Care  Discharge Orders    Future Orders Please Complete By Expires   Diet general      Activity as tolerated - No restrictions          Medication List     As of 03/01/2012  8:41 AM    TAKE these medications         oxyCODONE-acetaminophen 5-325 MG per tablet   Commonly known as: PERCOCET/ROXICET   Take 1-2 tablets by mouth every 4 (four) hours as needed.      promethazine 25 MG tablet   Commonly known as: PHENERGAN   Take 1 tablet (25 mg total) by mouth every 6 (six) hours as needed for nausea.           Follow-up Information    Follow up with Darletta Moll, MD. On  03/02/2012. (2:10pm)    Contact information:   [address] [place] [address] [phone]          Signed: Darletta Moll 03/01/2012, 8:41 AM

## 2012-03-01 NOTE — Progress Notes (Signed)
Discharge home.

## 2012-03-25 ENCOUNTER — Emergency Department (HOSPITAL_BASED_OUTPATIENT_CLINIC_OR_DEPARTMENT_OTHER)
Admission: EM | Admit: 2012-03-25 | Discharge: 2012-03-25 | Disposition: A | Discharge status: Home / Self Care | Payer: BC Managed Care – PPO | Attending: Emergency Medicine | Admitting: Emergency Medicine

## 2012-03-25 ENCOUNTER — Emergency Department (HOSPITAL_BASED_OUTPATIENT_CLINIC_OR_DEPARTMENT_OTHER): Payer: BC Managed Care – PPO

## 2012-03-25 ENCOUNTER — Encounter (HOSPITAL_BASED_OUTPATIENT_CLINIC_OR_DEPARTMENT_OTHER): Payer: Self-pay

## 2012-03-25 DIAGNOSIS — Z87828 Personal history of other (healed) physical injury and trauma: Secondary | ICD-10-CM | POA: Insufficient documentation

## 2012-03-25 DIAGNOSIS — Z79899 Other long term (current) drug therapy: Secondary | ICD-10-CM | POA: Insufficient documentation

## 2012-03-25 DIAGNOSIS — Z87891 Personal history of nicotine dependence: Secondary | ICD-10-CM | POA: Insufficient documentation

## 2012-03-25 DIAGNOSIS — R296 Repeated falls: Secondary | ICD-10-CM | POA: Insufficient documentation

## 2012-03-25 DIAGNOSIS — Y92009 Unspecified place in unspecified non-institutional (private) residence as the place of occurrence of the external cause: Secondary | ICD-10-CM | POA: Insufficient documentation

## 2012-03-25 DIAGNOSIS — X500XXA Overexertion from strenuous movement or load, initial encounter: Secondary | ICD-10-CM | POA: Insufficient documentation

## 2012-03-25 DIAGNOSIS — Z8709 Personal history of other diseases of the respiratory system: Secondary | ICD-10-CM | POA: Insufficient documentation

## 2012-03-25 DIAGNOSIS — S93409A Sprain of unspecified ligament of unspecified ankle, initial encounter: Secondary | ICD-10-CM | POA: Insufficient documentation

## 2012-03-25 DIAGNOSIS — Y9389 Activity, other specified: Secondary | ICD-10-CM | POA: Insufficient documentation

## 2012-03-25 MED ORDER — OXYCODONE-ACETAMINOPHEN 5-325 MG PO TABS
1.0000 | ORAL_TABLET | Freq: Once | ORAL | Status: AC
  Administered 2012-03-25: 1 via ORAL
  Filled 2012-03-25 (×2): qty 1

## 2012-03-25 MED ORDER — ONDANSETRON 4 MG PO TBDP
4.0000 mg | ORAL_TABLET | Freq: Once | ORAL | Status: AC
  Administered 2012-03-25: 4 mg via ORAL
  Filled 2012-03-25: qty 1

## 2012-03-25 MED ORDER — NAPROXEN 375 MG PO TABS: 375.0000 mg | ORAL_TABLET | Freq: Two times a day (BID) | ORAL | Status: DC

## 2012-03-25 MED ORDER — OXYCODONE-ACETAMINOPHEN 5-325 MG PO TABS: 1.0000 | ORAL_TABLET | ORAL | Status: DC | PRN

## 2012-03-25 MED ORDER — OSELTAMIVIR PHOSPHATE 75 MG PO CAPS: 75.0000 mg | ORAL_CAPSULE | Freq: Two times a day (BID) | ORAL | Status: DC

## 2012-03-25 MED ORDER — HYDROCODONE-HOMATROPINE 5-1.5 MG/5ML PO SYRP: 5.0000 mL | ORAL_SOLUTION | Freq: Four times a day (QID) | ORAL | Status: DC | PRN

## 2012-03-25 NOTE — ED Provider Notes (Signed)
History     CSN: [id number]  Arrival date & time 03/25/12  1743   First MD Initiated Contact with Patient 03/25/12 2017      Chief Complaint  Patient presents with  . Ankle Injury    (Consider location/radiation/quality/duration/timing/severity/associated sxs/prior treatment) HPI Comments: Richard Byrd is a 42 y.o. male that presents emergency department after twisting his ankle via a mechanical fall.  Patient states that he has damaged his ankle multiple times before in the past.  He states pain is worsened with weightbearing and came in with his own crutches.  Patient heard a loud pop at time of incident.  He denies numbness tingling or weakness of extremity.  In addition patient states that his family members have been diagnosed with a positive flu test.  Patient states that he did vaccine however he started developing symptoms today.  Patient requests Tamiflu prescription.  Patient is a 42 y.o. male presenting with lower extremity injury. The history is provided by the patient.  Ankle Injury    Past Medical History  Diagnosis Date  . Deviated septum     Past Surgical History  Procedure Date  . Knee surgery   . Fracture surgery   . Tonsillectomy   . Turbinate resection 03/01/2011  . Nasal septoplasty w/ turbinoplasty 02/28/2012    Procedure: NASAL SEPTOPLASTY WITH TURBINATE REDUCTION;  Surgeon: Darletta Moll, MD;  Location: [facility] OR;  Service: ENT;  Laterality: Bilateral;  SEPTOPLASTY AND BILATERAL TURBINATE RESECTION    . Adenoidectomy 02/28/2012    Procedure: ADENOIDECTOMY;  Surgeon: Darletta Moll, MD;  Location: [facility] OR;  Service: ENT;  Laterality: N/A;    History reviewed. No pertinent family history.  History  Substance Use Topics  . Smoking status: Former Smoker    Quit date: 02/29/1996  . Smokeless tobacco: Never Used  . Alcohol Use: Yes     Comment: OCCASIONAL      Review of Systems  All other systems reviewed and are negative.    Allergies  Tramadol and  Vicodin  Home Medications   Current Outpatient Rx  Name  Route  Sig  Dispense  Refill  . OXYCODONE-ACETAMINOPHEN 5-325 MG PO TABS   Oral   Take 1 tablet by mouth 2 (two) times daily.         Marland Kitchen PROMETHAZINE HCL 25 MG PO TABS   Oral   Take 1 tablet (25 mg total) by mouth every 6 (six) hours as needed for nausea.   12 tablet   1     BP 154/77  Pulse 101  Temp 98.3 F (36.8 C) (Oral)  Resp 22  Ht 6\' 2"  (1.88 m)  Wt 285 lb (129.275 kg)  BMI 36.59 kg/m2  SpO2 95%  Physical Exam  Nursing note and vitals reviewed. Constitutional: He appears well-developed and well-nourished. No distress.  HENT:  Head: Normocephalic and atraumatic.  Eyes: Conjunctivae normal and EOM are normal.  Neck: Normal range of motion. Neck supple.  Cardiovascular:       Intact distal pulses, capillary refill < 3 seconds  Pulmonary/Chest:       LCAB  Musculoskeletal:       Lateral malleolus swelling and tenderness to palpation.  Pain with inversion and eversion.  Patient unable to perform her range of motion do to pain All other extremities with normal ROM  Neurological:       No sensory deficit  Skin: He is not diaphoretic.       Skin intact,  no tenting    ED Course  Procedures (including critical care time)  Labs Reviewed - No data to display Dg Ankle Complete Right  03/25/2012  *RADIOLOGY REPORT*  Clinical Data: Right ankle pain and swelling following fall.  RIGHT ANKLE - COMPLETE 3+ VIEW  Comparison: 02/26/2012  Findings: There is no evidence of acute fracture, subluxation or dislocation. The ankle mortise is intact. Lateral soft tissue swelling is present. Mild degenerative changes at the tibiotalar joint noted. No focal bony lesions are identified.  IMPRESSION: Soft tissue swelling without evidence of acute bony abnormality.   Original Report Authenticated By: Harmon Pier, M.D.      No diagnosis found.    MDM  Ankle sprain Patient X-Ray negative for obvious fracture or dislocation.  Pain managed in ED. Pt advised to follow up with orthopedics if symptoms persist for possibility of missed fracture diagnosis. Patient given brace while in ED, conservative therapy recommended and discussed. Patient will be dc home & is agreeable with above plan.         Jaci Carrel, [place] 03/25/12 2105

## 2012-03-25 NOTE — ED Provider Notes (Signed)
Medical screening examination/treatment/procedure(s) were performed by non-physician practitioner and as supervising physician I was immediately available for consultation/collaboration.   Rolan Bucco, MD 03/25/12 2242

## 2012-03-25 NOTE — ED Notes (Signed)
Pt states that he fell at home and fell twisting his ankle inward and heard a loud pop.  Pt presents to triage with obvious swelling and bruising to R ankle.  Pt ambulating with one crutch.

## 2012-03-25 NOTE — ED Notes (Signed)
PA aware by this RN that pt. Has an allergy to hydrocodone. Instructed pt.to use OTC meds for cough. Pt. Requested pain meds (percocet) for pain. Pt. States he just started a pain management program. PA aware of this. Pt. States pain management MD is aware that he is here and may be prescribed percocet.

## 2012-03-28 NOTE — ED Provider Notes (Signed)
Medical screening examination/treatment/procedure(s) were performed by non-physician practitioner and as supervising physician I was immediately available for consultation/collaboration.   Michael Ventresca L Raymonde Hamblin, MD 03/28/12 1810 

## 2012-03-28 NOTE — ED Provider Notes (Signed)
Received a phone call from pharmacist Marijo Conception of [facility] in [place] on [address] he is concerned because the patient has multiple narcotic pain medication refills in the [place] [facility] system. He also states that he is a patient of pain management doctor.   Review of the [facility] database shows multiple prescriptions for narcotic pain medications from multiple providers. Advised the pharmacist not to fill his prescription and change prescription to naproxen 500 mg twice a day dispense 10.   Wynetta Emery, PA-C 03/28/12 [phone]

## 2012-06-12 ENCOUNTER — Institutional Professional Consult (permissible substitution): Payer: BC Managed Care – PPO | Admitting: Pulmonary Disease

## 2013-09-11 ENCOUNTER — Emergency Department (HOSPITAL_COMMUNITY)
Admission: EM | Admit: 2013-09-11 | Discharge: 2013-09-11 | Disposition: A | Discharge status: Home / Self Care | Payer: Medicaid Other | Attending: Emergency Medicine | Admitting: Emergency Medicine

## 2013-09-11 ENCOUNTER — Encounter (HOSPITAL_COMMUNITY): Payer: Self-pay | Admitting: Emergency Medicine

## 2013-09-11 DIAGNOSIS — Z8709 Personal history of other diseases of the respiratory system: Secondary | ICD-10-CM | POA: Insufficient documentation

## 2013-09-11 DIAGNOSIS — Z791 Long term (current) use of non-steroidal anti-inflammatories (NSAID): Secondary | ICD-10-CM | POA: Insufficient documentation

## 2013-09-11 DIAGNOSIS — R21 Rash and other nonspecific skin eruption: Secondary | ICD-10-CM | POA: Diagnosis present

## 2013-09-11 DIAGNOSIS — L255 Unspecified contact dermatitis due to plants, except food: Secondary | ICD-10-CM | POA: Insufficient documentation

## 2013-09-11 DIAGNOSIS — Z87891 Personal history of nicotine dependence: Secondary | ICD-10-CM | POA: Diagnosis not present

## 2013-09-11 DIAGNOSIS — Z79899 Other long term (current) drug therapy: Secondary | ICD-10-CM | POA: Diagnosis not present

## 2013-09-11 MED ORDER — METHYLPREDNISOLONE SODIUM SUCC 125 MG IJ SOLR
125.0000 mg | Freq: Once | INTRAMUSCULAR | Status: AC
  Administered 2013-09-11: 125 mg via INTRAVENOUS
  Filled 2013-09-11: qty 2

## 2013-09-11 MED ORDER — FAMOTIDINE 20 MG PO TABS
20.0000 mg | ORAL_TABLET | Freq: Once | ORAL | Status: AC
  Administered 2013-09-11: 20 mg via ORAL
  Filled 2013-09-11: qty 1

## 2013-09-11 MED ORDER — PREDNISONE 10 MG PO TABS: ORAL_TABLET | ORAL | Status: DC

## 2013-09-11 MED ORDER — FAMOTIDINE 20 MG PO TABS: 20.0000 mg | ORAL_TABLET | Freq: Two times a day (BID) | ORAL | Status: DC

## 2013-09-11 MED ORDER — HYDROXYZINE HCL 25 MG PO TABS: 25.0000 mg | ORAL_TABLET | Freq: Four times a day (QID) | ORAL | Status: DC

## 2013-09-11 NOTE — ED Notes (Signed)
Pt. reports progressing itchy hives / rashes at arms , torso , face and neck onset Sunday unrelieved by OTC Caladryl and generic antihistamines. Respirations unlabored / airway intact .

## 2013-09-11 NOTE — Discharge Instructions (Signed)
Continue vistaril, pepcid, take prednisone for your symptoms. Follow up with your doctor. Return if not improving.    Contact Dermatitis Contact dermatitis is a reaction to certain substances that touch the skin. Contact dermatitis can be either irritant contact dermatitis or allergic contact dermatitis. Irritant contact dermatitis does not require previous exposure to the substance for a reaction to occur.Allergic contact dermatitis only occurs if you have been exposed to the substance before. Upon a repeat exposure, your body reacts to the substance.  CAUSES  Many substances can cause contact dermatitis. Irritant dermatitis is most commonly caused by repeated exposure to mildly irritating substances, such as:  Makeup.  Soaps.  Detergents.  Bleaches.  Acids.  Metal salts, such as nickel. Allergic contact dermatitis is most commonly caused by exposure to:  Poisonous plants.  Chemicals (deodorants, shampoos).  Jewelry.  Latex.  Neomycin in triple antibiotic cream.  Preservatives in products, including clothing. SYMPTOMS  The area of skin that is exposed may develop:  Dryness or flaking.  Redness.  Cracks.  Itching.  Pain or a burning sensation.  Blisters. With allergic contact dermatitis, there may also be swelling in areas such as the eyelids, mouth, or genitals.  DIAGNOSIS  Your caregiver can usually tell what the problem is by doing a physical exam. In cases where the cause is uncertain and an allergic contact dermatitis is suspected, a patch skin test may be performed to help determine the cause of your dermatitis. TREATMENT Treatment includes protecting the skin from further contact with the irritating substance by avoiding that substance if possible. Barrier creams, powders, and gloves may be helpful. Your caregiver may also recommend:  Steroid creams or ointments applied 2 times daily. For best results, soak the rash area in cool water for 20 minutes. Then  apply the medicine. Cover the area with a plastic wrap. You can store the steroid cream in the refrigerator for a "chilly" effect on your rash. That may decrease itching. Oral steroid medicines may be needed in more severe cases.  Antibiotics or antibacterial ointments if a skin infection is present.  Antihistamine lotion or an antihistamine taken by mouth to ease itching.  Lubricants to keep moisture in your skin.  Burow's solution to reduce redness and soreness or to dry a weeping rash. Mix one packet or tablet of solution in 2 cups cool water. Dip a clean washcloth in the mixture, wring it out a bit, and put it on the affected area. Leave the cloth in place for 30 minutes. Do this as often as possible throughout the day.  Taking several cornstarch or baking soda baths daily if the area is too large to cover with a washcloth. Harsh chemicals, such as alkalis or acids, can cause skin damage that is like a burn. You should flush your skin for 15 to 20 minutes with cold water after such an exposure. You should also seek immediate medical care after exposure. Bandages (dressings), antibiotics, and pain medicine may be needed for severely irritated skin.  HOME CARE INSTRUCTIONS  Avoid the substance that caused your reaction.  Keep the area of skin that is affected away from hot water, soap, sunlight, chemicals, acidic substances, or anything else that would irritate your skin.  Do not scratch the rash. Scratching may cause the rash to become infected.  You may take cool baths to help stop the itching.  Only take over-the-counter or prescription medicines as directed by your caregiver.  See your caregiver for follow-up care as directed to  make sure your skin is healing properly. SEEK MEDICAL CARE IF:   Your condition is not better after 3 days of treatment.  You seem to be getting worse.  You see signs of infection such as swelling, tenderness, redness, soreness, or warmth in the affected  area.  You have any problems related to your medicines. Document Released: 02/12/2000 Document Revised: 05/09/2011 Document Reviewed: 07/20/2010 [facility] Patient Information 2015 [place], [place]. This information is not intended to replace advice given to you by your health care provider. Make sure you discuss any questions you have with your health care provider.  Poison [facility] is an inflammation of the skin (contact dermatitis). It is caused by contact with the allergens on the leaves of the oak (toxicodendron) plants. Depending on your sensitivity, the rash may consist simply of redness and itching, or it may also progress to blisters which may break open (rupture). These must be well cared for to prevent secondary germ (bacterial) infection as these infections can lead to scarring. The eyes may also get puffy. The puffiness is worst in the morning and gets better as the day progresses. Healing is best accomplished by keeping any open areas dry, clean, covered with a bandage, and covered with an antibacterial ointment if needed. Without secondary infection, this dermatitis usually heals without scarring within 2 to 3 weeks without treatment. HOME CARE INSTRUCTIONS When you have been exposed to poison oak, it is very important to thoroughly wash with soap and water as soon as the exposure has been discovered. You have about one half hour to remove the plant resin before it will cause the rash. This cleaning will quickly destroy the oil or antigen on the skin (the antigen is what causes the rash). Wash aggressively under the fingernails as any plant resin still there will continue to spread the rash. Do not rub skin vigorously when washing affected area. Poison oak cannot spread if no oil from the plant remains on your body. Rash that has progressed to weeping sores (lesions) will not spread the rash unless you have not washed thoroughly. It is also important to clean any clothes you have been  wearing as they may carry active allergens which will spread the rash, even several days later. Avoidance of the plant in the future is the best measure. Poison oak plants can be recognized by the number of leaves. Generally, poison oak has three leaves with flowering branches on a single stem. Diphenhydramine may be purchased over the counter and used as needed for itching. Do not drive with this medication if it makes you drowsy. Ask your caregiver about medication for children. SEEK IMMEDIATE MEDICAL CARE IF:   Open areas of the rash develop.  You notice redness extending beyond the area of the rash.  There is a pus like discharge.  There is increased pain.  Other signs of infection develop (such as fever). Document Released: 08/21/2002 Document Revised: 05/09/2011 Document Reviewed: 12/31/2008 [facility] Patient Information 2015 [place], [place]. This information is not intended to replace advice given to you by your health care provider. Make sure you discuss any questions you have with your health care provider.

## 2013-09-11 NOTE — ED Provider Notes (Signed)
CSN: [id number]     Arrival date & time 09/11/13  4098 History   First MD Initiated Contact with Patient 09/11/13 [phone]     Chief Complaint  Patient presents with  . Urticaria     (Consider location/radiation/quality/duration/timing/severity/associated sxs/prior Treatment) HPI Richard Byrd is a 43 y.o. male who presents to ED with complaint of a rash. Pt states he worked in the yard 4 days ago, and states after noticed an itchy rash to his arms. States since then rash has spread now to his torso, neck, face, left ear. Pt states rash is itchy. States he went to the area where he was pulling weeds and found some poison oak there. Pt states he has been taking benadryl and applying calamine lotion with no relief. He denies any new products or soaps at home. He denies any other allergies. Denies any respiratory complaints. No rash on oral mucosa. Denies any fever, chills, URI symptoms, malaise.   Past Medical History  Diagnosis Date  . Deviated septum    Past Surgical History  Procedure Laterality Date  . Knee surgery    . Fracture surgery    . Tonsillectomy    . Turbinate resection  03/01/2011  . Nasal septoplasty w/ turbinoplasty  02/28/2012    Procedure: NASAL SEPTOPLASTY WITH TURBINATE REDUCTION;  Surgeon: Ascencion Dike, MD;  Location: [place];  Service: ENT;  Laterality: Bilateral;  SEPTOPLASTY AND BILATERAL TURBINATE RESECTION    . Adenoidectomy  02/28/2012    Procedure: ADENOIDECTOMY;  Surgeon: Ascencion Dike, MD;  Location: [facility];  Service: ENT;  Laterality: N/A;   No family history on file. History  Substance Use Topics  . Smoking status: Former Smoker    Quit date: 02/29/1996  . Smokeless tobacco: Never Used  . Alcohol Use: Yes     Comment: OCCASIONAL    Review of Systems  Constitutional: Negative for fever and chills.  HENT: Negative for congestion, mouth sores and sore throat.   Eyes: Negative for pain, redness and itching.  Respiratory: Negative for cough and shortness of  breath.   Cardiovascular: Negative for chest pain.  Gastrointestinal: Negative for nausea, vomiting, abdominal pain and constipation.  Musculoskeletal: Negative for myalgias, neck pain and neck stiffness.  Skin: Positive for rash.  Neurological: Negative for dizziness and headaches.      Allergies  Tramadol and Vicodin  Home Medications   Prior to Admission medications   Medication Sig Start Date End Date Taking? Authorizing Provider  naproxen (NAPROSYN) 375 MG tablet Take 1 tablet (375 mg total) by mouth 2 (two) times daily. 03/25/12   Lisette Paz, PA-C  oseltamivir (TAMIFLU) 75 MG capsule Take 1 capsule (75 mg total) by mouth every 12 (twelve) hours. 03/25/12   Lisette Paz, PA-C  oxyCODONE-acetaminophen (PERCOCET/ROXICET) 5-325 MG per tablet Take 1 tablet by mouth 2 (two) times daily. 03/01/12   Ascencion Dike, MD  oxyCODONE-acetaminophen (ROXICET) 5-325 MG per tablet Take 1 tablet by mouth every 4 (four) hours as needed for pain. 03/25/12   Lisette Paz, PA-C  promethazine (PHENERGAN) 25 MG tablet Take 1 tablet (25 mg total) by mouth every 6 (six) hours as needed for nausea. 03/01/12   Ascencion Dike, MD   BP 119/74  Pulse 67  Temp(Src) 97.8 F (36.6 C) (Oral)  Resp 20  Ht 6\' 2"  (1.88 m)  Wt 300 lb (136.079 kg)  BMI 38.50 kg/m2  SpO2 99% Physical Exam  Nursing note and vitals reviewed. Constitutional: He is  oriented to person, place, and time. He appears well-developed. No distress.  HENT:  Head: Normocephalic.  Right Ear: External ear normal.  Nose: Nose normal.  Mouth/Throat: Oropharynx is clear and moist.  Swelling and erythema of the left outer ear. Ear canals normal bilaterally, TMs normal bilaterally. No Oral mucosal rash or lesions. No uvula edema. Normal tongue and oropharynx.   Eyes: Conjunctivae are normal.  Neck: Neck supple.  Cardiovascular: Normal rate, regular rhythm and normal heart sounds.   Pulmonary/Chest: Effort normal. No respiratory distress. He has no wheezes. He  has no rales.  Musculoskeletal: He exhibits no edema.  Neurological: He is alert and oriented to person, place, and time.  Skin: Skin is warm and dry.  Erythematous, papular rash in linear and grouped patterns over bilateral forearms, left ribs, left cheek, forehead, left ear    ED Course  Procedures (including critical care time) Labs Review Labs Reviewed - No data to display  Imaging Review No results found.   EKG Interpretation None      MDM   Final diagnoses:  Contact dermatitis due to plant    Pt with rash to the arms, torso, face, neck, ear, after working in the yard. No respiratory distress. No new medications. No new products.  No oral mucosal involvement. Rash is blanching.   Most likely contact dermatitis. Pt already taking benadryl. Will try solu medrol, pepcid here. Home with short prednisone taper, continue calamine lotion, benadryl, pepcid. Follow up with PCP. Discussed signs and symptoms that should prompt his return. Pt agreed tot he plan and voiced undernstanding.   Filed Vitals:   09/11/13 0626 09/11/13 0716  BP: 119/74 136/76  Pulse: 67 86  Temp: 97.8 F (36.6 C)   TempSrc: Oral   Resp: 20 14  Height: 6\' 2"  (1.88 m)   Weight: 300 lb (136.079 kg)   SpO2: 99% 98%        Renold Genta, PA-C 09/11/13 9833

## 2013-09-12 NOTE — ED Provider Notes (Signed)
Medical screening examination/treatment/procedure(s) were performed by non-physician practitioner and as supervising physician I was immediately available for consultation/collaboration.   EKG Interpretation None        Julianne Rice, MD 09/12/13 [phone]

## 2014-01-05 IMAGING — CR DG KNEE 1-2V*R*
2 series · 2 of 2 positions shown · non-contrast
Comparison: None.

CLINICAL DATA: Pain.  History of surgery in 8818.

RIGHT KNEE - 1-2 VIEW

[t knee ap right]
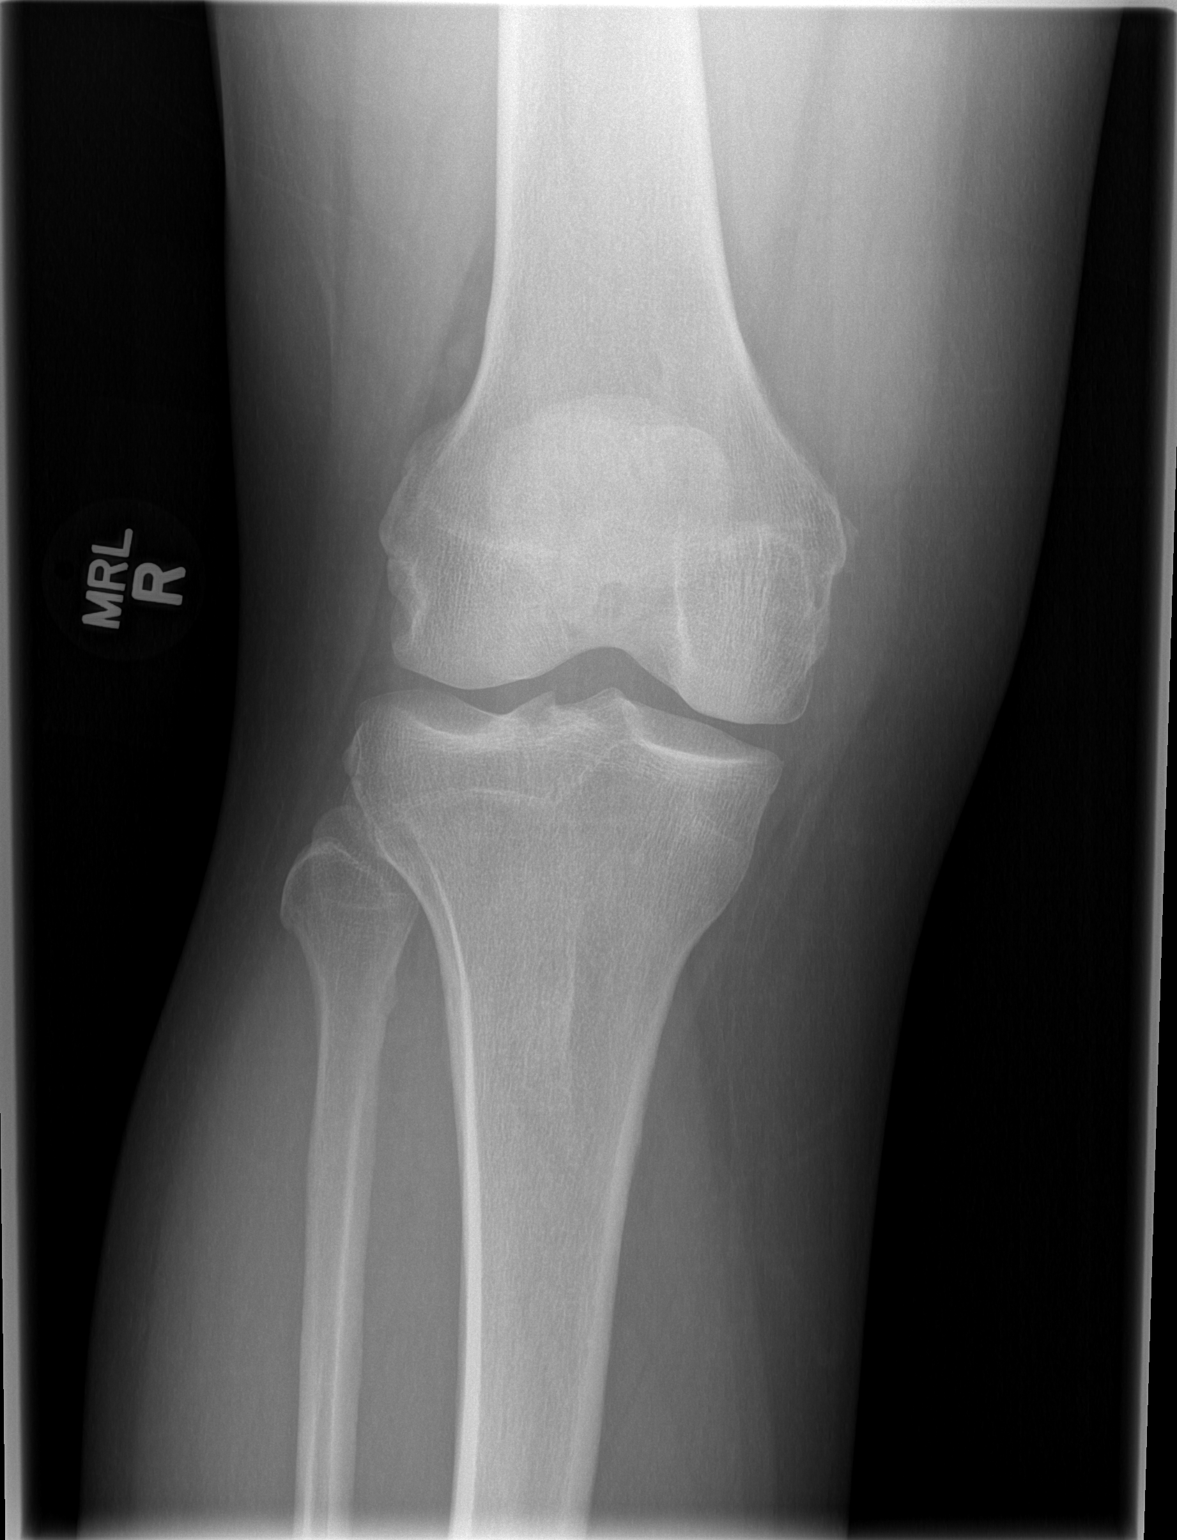

[t knee lat right]
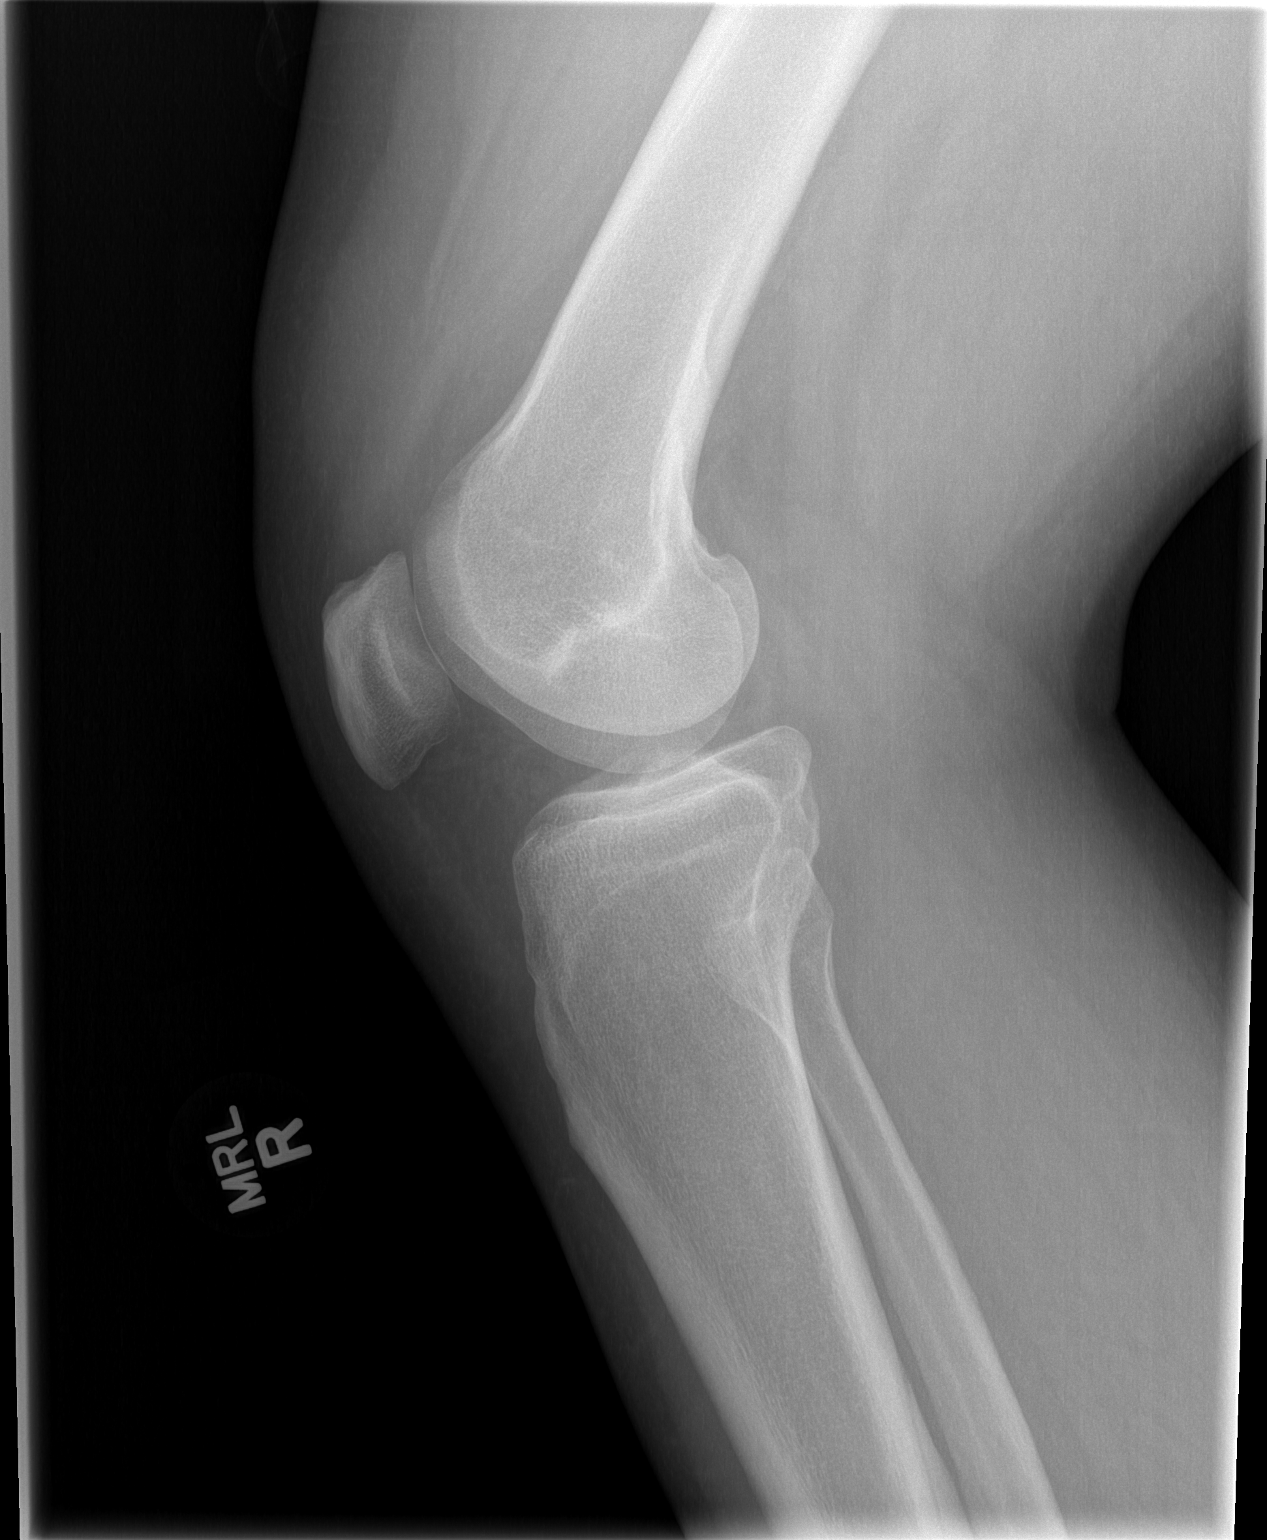

[2 of 2 positions shown; findings below may reference images not displayed]

FINDINGS: There is no fracture or dislocation.  No joint effusion.
No evidence of degenerative change. Small ossification off the
medial femoral condyle may be due to old MCL injury.  Soft tissues
unremarkable.
IMPRESSION: No acute finding.

## 2015-04-04 ENCOUNTER — Encounter (HOSPITAL_COMMUNITY): Payer: Self-pay

## 2015-04-04 ENCOUNTER — Emergency Department (HOSPITAL_COMMUNITY)
Admission: EM | Admit: 2015-04-04 | Discharge: 2015-04-04 | Disposition: A | Discharge status: Home / Self Care | Payer: Medicaid Other | Attending: Emergency Medicine | Admitting: Emergency Medicine

## 2015-04-04 ENCOUNTER — Emergency Department (HOSPITAL_COMMUNITY): Payer: Medicaid Other

## 2015-04-04 DIAGNOSIS — M25511 Pain in right shoulder: Secondary | ICD-10-CM | POA: Diagnosis not present

## 2015-04-04 DIAGNOSIS — Z8709 Personal history of other diseases of the respiratory system: Secondary | ICD-10-CM | POA: Insufficient documentation

## 2015-04-04 DIAGNOSIS — Z87891 Personal history of nicotine dependence: Secondary | ICD-10-CM | POA: Diagnosis not present

## 2015-04-04 DIAGNOSIS — Z8669 Personal history of other diseases of the nervous system and sense organs: Secondary | ICD-10-CM | POA: Insufficient documentation

## 2015-04-04 DIAGNOSIS — Z79899 Other long term (current) drug therapy: Secondary | ICD-10-CM | POA: Diagnosis not present

## 2015-04-04 DIAGNOSIS — E669 Obesity, unspecified: Secondary | ICD-10-CM | POA: Insufficient documentation

## 2015-04-04 MED ORDER — OXYCODONE-ACETAMINOPHEN 5-325 MG PO TABS
1.0000 | ORAL_TABLET | Freq: Once | ORAL | Status: AC
  Administered 2015-04-04: 1 via ORAL
  Filled 2015-04-04: qty 1

## 2015-04-04 MED ORDER — PREDNISONE 20 MG PO TABS: ORAL_TABLET | ORAL | Status: DC

## 2015-04-04 MED ORDER — ETODOLAC 400 MG PO TABS: 400.0000 mg | ORAL_TABLET | Freq: Two times a day (BID) | ORAL | Status: DC | PRN

## 2015-04-04 NOTE — ED Provider Notes (Signed)
CSN: [id number]     Arrival date & time 04/04/15  0155 History   None    Chief Complaint  Patient presents with  . Shoulder Pain     (Consider location/radiation/quality/duration/timing/severity/associated sxs/prior Treatment) HPI Comments: Presents to the emergency department for evaluation of pain in the right shoulder. Patient reports that he has been having symptoms of pain in the right shoulder for the last 2 months, but the last 2 days symptoms have significantly worsened. 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 He has noticed increased pain with movement of the shoulder, but denies any direct injury. In the last 24-48 hours, he has had difficulty moving the arm or raising the ER because of increased pain with movement.  Patient is a 45 y.o. male presenting with shoulder pain.  Shoulder Pain   Past Medical History  Diagnosis Date  . Deviated septum   . Hyperlipidemia   . Obese   . Other dyspnea and respiratory abnormality   . Observation for suspected cardiovascular disease   . Sleep disturbance, unspecified    Past Surgical History  Procedure Laterality Date  . Knee surgery    . Fracture surgery    . Tonsillectomy    . Turbinate resection  03/01/2011  . Nasal septoplasty w/ turbinoplasty  02/28/2012    Procedure: NASAL SEPTOPLASTY WITH TURBINATE REDUCTION;  Surgeon: Ascencion Dike, MD;  Location: [facility];  Service: ENT;  Laterality: Bilateral;  SEPTOPLASTY AND BILATERAL TURBINATE RESECTION    . Adenoidectomy  02/28/2012    Procedure: ADENOIDECTOMY;  Surgeon: Ascencion Dike, MD;  Location: [facility] OR;  Service: ENT;  Laterality: N/A;   Family History  Problem Relation Age of Onset  . Heart disease Father   . Heart disease Paternal Grandfather    Social History  Substance Use Topics  . Smoking status: Former Smoker    Quit date: 02/29/1996  . Smokeless tobacco: Never Used  . Alcohol Use: Yes     Comment: OCCASIONAL    Review of Systems  Musculoskeletal: Positive for  arthralgias.  All other systems reviewed and are negative.     Allergies  Tramadol and Vicodin  Home Medications   Prior to Admission medications   Medication Sig Start Date End Date Taking? Authorizing Provider  calamine lotion Apply 1 application topically daily as needed for itching.    Historical Provider, MD  diphenhydrAMINE (BENADRYL) 25 MG tablet Take 25-500 mg by mouth every 6 (six) hours as needed for itching.    Historical Provider, MD  famotidine (PEPCID) 20 MG tablet Take 1 tablet (20 mg total) by mouth 2 (two) times daily. 09/11/13   Tatyana Kirichenko, PA-C  hydrOXYzine (ATARAX/VISTARIL) 25 MG tablet Take 1 tablet (25 mg total) by mouth every 6 (six) hours. 09/11/13   Tatyana Kirichenko, PA-C  predniSONE (DELTASONE) 10 MG tablet Take 5 tab day 1, take 4 tab day 2, take 3 tab day 3, take 2 tab day 4, and take 1 tab day 5 09/11/13   Tatyana Kirichenko, PA-C  PRESCRIPTION MEDICATION Inject 1 Applicatorful into the skin 2 (two) times daily. "HCG" weight loss shot    Historical Provider, MD   There were no vitals taken for this visit. Physical Exam  Constitutional: He is oriented to person, place, and time. He appears well-developed and well-nourished. No distress.  HENT:  Head: Normocephalic and atraumatic.  Right Ear: Hearing normal.  Left Ear: Hearing normal.  Nose: Nose normal.  Mouth/Throat: Oropharynx is clear  and moist and mucous membranes are normal.  Eyes: Conjunctivae and EOM are normal. Pupils are equal, round, and reactive to light.  Neck: Normal range of motion. Neck supple.  Cardiovascular: Regular rhythm, S1 normal and S2 normal.  Exam reveals no gallop and no friction rub.   No murmur heard. Pulmonary/Chest: Effort normal and breath sounds normal. No respiratory distress. He exhibits no tenderness.  Abdominal: Soft. Normal appearance and bowel sounds are normal. There is no hepatosplenomegaly. There is no tenderness. There is no rebound, no guarding, no  tenderness at McBurney's point and negative Murphy's sign. No hernia.  Musculoskeletal:       Right shoulder: He exhibits decreased range of motion, tenderness and swelling. He exhibits no deformity.       Arms: Neurological: He is alert and oriented to person, place, and time. He has normal strength. No cranial nerve deficit or sensory deficit. Coordination normal. GCS eye subscore is 4. GCS verbal subscore is 5. GCS motor subscore is 6.  Skin: Skin is warm, dry and intact. No rash noted. No cyanosis.  Psychiatric: He has a normal mood and affect. His speech is normal and behavior is normal. Thought content normal.  Nursing note and vitals reviewed.   ED Course  Procedures (including critical care time) Labs Review Labs Reviewed - No data to display  Imaging Review No results found. I have personally reviewed and evaluated these images and lab results as part of my medical decision-making.   EKG Interpretation None      MDM   Final diagnoses:  None   right shoulder pain  Presents to the ER for evaluation of pain in the right shoulder. Symptoms present for 2 months, progressively worsening. Patient has severe pain on the top portion of the shoulder with range of motion. X-ray unremarkable. Treat with analgesia and rest, follow-up orthopedics to rule out rotator cuff injury.    Orpah Greek, MD 04/04/15 [phone]

## 2015-04-04 NOTE — ED Notes (Signed)
Pt reports pain to right shoulder x 2 months, states is painful to lift and use his arm.

## 2015-04-04 NOTE — Discharge Instructions (Signed)

## 2015-09-10 ENCOUNTER — Encounter (HOSPITAL_BASED_OUTPATIENT_CLINIC_OR_DEPARTMENT_OTHER): Payer: Self-pay | Admitting: Emergency Medicine

## 2015-09-10 DIAGNOSIS — E669 Obesity, unspecified: Secondary | ICD-10-CM | POA: Diagnosis not present

## 2015-09-10 DIAGNOSIS — Z87891 Personal history of nicotine dependence: Secondary | ICD-10-CM | POA: Diagnosis not present

## 2015-09-10 DIAGNOSIS — M109 Gout, unspecified: Secondary | ICD-10-CM | POA: Diagnosis not present

## 2015-09-10 DIAGNOSIS — Z6841 Body Mass Index (BMI) 40.0 and over, adult: Secondary | ICD-10-CM | POA: Diagnosis not present

## 2015-09-10 DIAGNOSIS — E785 Hyperlipidemia, unspecified: Secondary | ICD-10-CM | POA: Diagnosis not present

## 2015-09-10 DIAGNOSIS — M79675 Pain in left toe(s): Secondary | ICD-10-CM | POA: Diagnosis present

## 2015-09-10 NOTE — ED Notes (Signed)
Patient states that he has swelling and pain to his left large toe x 2 days

## 2015-09-11 ENCOUNTER — Emergency Department (HOSPITAL_BASED_OUTPATIENT_CLINIC_OR_DEPARTMENT_OTHER)
Admission: EM | Admit: 2015-09-11 | Discharge: 2015-09-11 | Disposition: A | Discharge status: Home / Self Care | Payer: Medicaid Other | Attending: Emergency Medicine | Admitting: Emergency Medicine

## 2015-09-11 DIAGNOSIS — M109 Gout, unspecified: Secondary | ICD-10-CM

## 2015-09-11 MED ORDER — INDOMETHACIN 50 MG PO CAPS: 50.0000 mg | ORAL_CAPSULE | Freq: Three times a day (TID) | ORAL | Status: DC

## 2015-09-11 MED ORDER — INDOMETHACIN 25 MG PO CAPS
50.0000 mg | ORAL_CAPSULE | Freq: Once | ORAL | Status: AC
  Administered 2015-09-11: 50 mg via ORAL
  Filled 2015-09-11: qty 2

## 2015-09-11 MED ORDER — OXYCODONE-ACETAMINOPHEN 5-325 MG PO TABS
1.0000 | ORAL_TABLET | Freq: Once | ORAL | Status: AC
  Administered 2015-09-11: 1 via ORAL
  Filled 2015-09-11: qty 1

## 2015-09-11 MED ORDER — OXYCODONE-ACETAMINOPHEN 5-325 MG PO TABS: 1.0000 | ORAL_TABLET | Freq: Every evening | ORAL | Status: DC | PRN

## 2015-09-11 NOTE — Discharge Instructions (Signed)

## 2015-09-11 NOTE — ED Provider Notes (Signed)
CSN: [id number]     Arrival date & time 09/10/15  2335 History   First MD Initiated Contact with Patient 09/11/15 0014     Chief Complaint  Patient presents with  . Toe Pain     (Consider location/radiation/quality/duration/timing/severity/associated sxs/prior Treatment) HPI Patient presents with 2 days gradual onset of left great toe pain. He's had increased swelling and redness. States that last night just having a sheet on top of it was causing it to hurt. He's had no fever or chills. No previous history of similar symptoms. Noted that he ate shellfish day before symptom onset. Denies any trauma. Past Medical History  Diagnosis Date  . Deviated septum   . Hyperlipidemia   . Obese   . Other dyspnea and respiratory abnormality   . Observation for suspected cardiovascular disease   . Sleep disturbance, unspecified    Past Surgical History  Procedure Laterality Date  . Knee surgery    . Fracture surgery    . Tonsillectomy    . Turbinate resection  03/01/2011  . Nasal septoplasty w/ turbinoplasty  02/28/2012    Procedure: NASAL SEPTOPLASTY WITH TURBINATE REDUCTION;  Surgeon: Ascencion Dike, MD;  Location: [place];  Service: ENT;  Laterality: Bilateral;  SEPTOPLASTY AND BILATERAL TURBINATE RESECTION    . Adenoidectomy  02/28/2012    Procedure: ADENOIDECTOMY;  Surgeon: Ascencion Dike, MD;  Location: [facility] OR;  Service: ENT;  Laterality: N/A;   Family History  Problem Relation Age of Onset  . Heart disease Father   . Heart disease Paternal Grandfather    Social History  Substance Use Topics  . Smoking status: Former Smoker    Quit date: 02/29/1996  . Smokeless tobacco: Never Used  . Alcohol Use: Yes     Comment: OCCASIONAL    Review of Systems  Constitutional: Negative for fever and chills.  Musculoskeletal: Positive for joint swelling and arthralgias.  Skin: Positive for color change. Negative for wound.  All other systems reviewed and are negative.     Allergies  Tramadol and  Vicodin  Home Medications   Prior to Admission medications   Medication Sig Start Date End Date Taking? Authorizing Provider  calamine lotion Apply 1 application topically daily as needed for itching.    Historical Provider, MD  diphenhydrAMINE (BENADRYL) 25 MG tablet Take 25-500 mg by mouth every 6 (six) hours as needed for itching.    Historical Provider, MD  etodolac (LODINE) 400 MG tablet Take 1 tablet (400 mg total) by mouth 2 (two) times daily as needed for moderate pain. 04/04/15   Orpah Greek, MD  famotidine (PEPCID) 20 MG tablet Take 1 tablet (20 mg total) by mouth 2 (two) times daily. 09/11/13   Tatyana Kirichenko, PA-C  ibuprofen (ADVIL,MOTRIN) 600 MG tablet Take 600 mg by mouth every 6 (six) hours as needed.    Historical Provider, MD  indomethacin (INDOCIN) 50 MG capsule Take 1 capsule (50 mg total) by mouth 3 (three) times daily after meals. 09/11/15   Julianne Rice, MD  oxyCODONE-acetaminophen (PERCOCET) 5-325 MG tablet Take 1 tablet by mouth at bedtime as needed for severe pain. 09/11/15   Julianne Rice, MD  predniSONE (DELTASONE) 20 MG tablet 3 tabs po daily x 3 days, then 2 tabs x 3 days, then 1.5 tabs x 3 days, then 1 tab x 3 days, then 0.5 tabs x 3 days 04/04/15   Orpah Greek, MD  PRESCRIPTION MEDICATION Inject 1 Applicatorful into the skin 2 (two) times  daily. "HCG" weight loss shot    Historical Provider, MD   BP 130/87 mmHg  Pulse 86  Temp(Src) 98.9 F (37.2 C) (Oral)  Resp 18  Ht 6\' 2"  (1.88 m)  Wt 325 lb (147.419 kg)  BMI 41.71 kg/m2  SpO2 100% Physical Exam  Constitutional: He is oriented to person, place, and time. He appears well-developed and well-nourished. No distress.  HENT:  Head: Normocephalic and atraumatic.  Eyes: EOM are normal. Pupils are equal, round, and reactive to light.  Neck: Normal range of motion. Neck supple.  Cardiovascular: Normal rate and regular rhythm.   Pulmonary/Chest: Effort normal and breath sounds normal.   Abdominal: Soft. Bowel sounds are normal.  Musculoskeletal: Normal range of motion. He exhibits tenderness. He exhibits no edema.  Patient with tenderness to palpation over the MTP joint of the great toe of the left foot. Overlying erythema and warmth. Mild swelling noted. No tracking. Distal pulses are equal and intact.  Neurological: He is alert and oriented to person, place, and time.  Moves all extremities without deficit. Sensation intact.  Skin: Skin is warm and dry. No rash noted. There is erythema.  Psychiatric: He has a normal mood and affect. His behavior is normal.  Nursing note and vitals reviewed.   ED Course  Procedures (including critical care time) Labs Review Labs Reviewed - No data to display  Imaging Review No results found. I have personally reviewed and evaluated these images and lab results as part of my medical decision-making.   EKG Interpretation None      MDM   Final diagnoses:  Acute gout of left foot, unspecified cause    Clinical exam consistent with gout. Low suspicion for septic arthritis. We'll start on anti-inflammatories and give small supply of Percocet for acute pain at night. Advised to follow with his primary physician.    Julianne Rice, MD 09/11/15 [phone]

## 2016-08-17 ENCOUNTER — Encounter (HOSPITAL_BASED_OUTPATIENT_CLINIC_OR_DEPARTMENT_OTHER): Payer: Self-pay

## 2016-08-17 ENCOUNTER — Emergency Department (HOSPITAL_BASED_OUTPATIENT_CLINIC_OR_DEPARTMENT_OTHER)
Admission: EM | Admit: 2016-08-17 | Discharge: 2016-08-17 | Disposition: A | Discharge status: Home / Self Care | Payer: Medicaid Other | Attending: Emergency Medicine | Admitting: Emergency Medicine

## 2016-08-17 DIAGNOSIS — Z87891 Personal history of nicotine dependence: Secondary | ICD-10-CM | POA: Diagnosis not present

## 2016-08-17 DIAGNOSIS — M79675 Pain in left toe(s): Secondary | ICD-10-CM | POA: Diagnosis present

## 2016-08-17 DIAGNOSIS — M10072 Idiopathic gout, left ankle and foot: Secondary | ICD-10-CM

## 2016-08-17 DIAGNOSIS — M109 Gout, unspecified: Secondary | ICD-10-CM | POA: Diagnosis not present

## 2016-08-17 MED ORDER — OXYCODONE-ACETAMINOPHEN 5-325 MG PO TABS: 1.0000 | ORAL_TABLET | Freq: Four times a day (QID) | ORAL | 0 refills | Status: DC | PRN

## 2016-08-17 MED ORDER — COLCHICINE 0.6 MG PO TABS: ORAL_TABLET | ORAL | 0 refills | Status: AC

## 2016-08-17 MED FILL — COLCHICINE 0.6 MG TABLET: 0.6 | 2 days supply | Qty: 10 | Fill #0

## 2016-08-17 MED FILL — OXYCODONE/APAP 5/325 MG TAB: 5-325 | 2 days supply | Qty: 10 | Fill #0

## 2016-08-17 NOTE — ED Triage Notes (Signed)
C/o left great toe pain x 1 week-took someone's gout med-toe better-now worse-NAD-limping gait

## 2016-08-17 NOTE — ED Provider Notes (Signed)
[facility] Provider Note   CSN: [id number] Arrival date & time: 08/17/16  1313     History   Chief Complaint Chief Complaint  Patient presents with  . Toe Pain    HPI Richard Byrd. is a 46 y.o. male.  Patient is a 46 year old male with history of gout presenting with left great toe pain and swelling. This began in the absence of any injury or trauma. He has taken colchicine that was prescribed for a family member which has helped and is requesting this medication. He denies any fevers or chills. His pain is worse with ambulation and palpation. There are no alleviating factors with the exception of the medication listed above.      Past Medical History:  Diagnosis Date  . Deviated septum   . Hyperlipidemia   . Obese   . Observation for suspected cardiovascular disease   . Other dyspnea and respiratory abnormality   . Sleep disturbance, unspecified     There are no active problems to display for this patient.   Past Surgical History:  Procedure Laterality Date  . ADENOIDECTOMY  02/28/2012   Procedure: ADENOIDECTOMY;  Surgeon: Ascencion Dike, MD;  Location: [place];  Service: ENT;  Laterality: N/A;  . FRACTURE SURGERY    . KNEE SURGERY    . NASAL SEPTOPLASTY W/ TURBINOPLASTY  02/28/2012   Procedure: NASAL SEPTOPLASTY WITH TURBINATE REDUCTION;  Surgeon: Ascencion Dike, MD;  Location: [place];  Service: ENT;  Laterality: Bilateral;  SEPTOPLASTY AND BILATERAL TURBINATE RESECTION    . TONSILLECTOMY    . TURBINATE RESECTION  03/01/2011       Home Medications    Prior to Admission medications   Not on File    Family History Family History  Problem Relation Age of Onset  . Heart disease Father   . Heart disease Paternal Grandfather     Social History Social History  Substance Use Topics  . Smoking status: Former Smoker    Quit date: 02/29/1996  . Smokeless tobacco: Never Used  . Alcohol use Yes     Comment: OCCASIONAL     Allergies   Tramadol  and Vicodin [hydrocodone-acetaminophen]   Review of Systems Review of Systems  All other systems reviewed and are negative.    Physical Exam Updated Vital Signs BP 134/89 (BP Location: Left Arm)   Pulse 92   Temp 98.2 F (36.8 C) (Oral)   Resp (!) 22   Ht 6' 2.5" (1.892 m)   Wt (!) 156.2 kg (344 lb 4.8 oz)   SpO2 98%   BMI 43.61 kg/m   Physical Exam  Constitutional: He is oriented to person, place, and time. He appears well-developed and well-nourished. No distress.  HENT:  Head: Normocephalic and atraumatic.  Neck: Normal range of motion. Neck supple.  Cardiovascular: Normal rate.   Pulmonary/Chest: Effort normal.  Musculoskeletal:  There is swelling, pain, warmth to the left great toe over the MCP joint. There is pain with any range of motion.  Neurological: He is alert and oriented to person, place, and time.  Skin: He is not diaphoretic.  Nursing note and vitals reviewed.    ED Treatments / Results  Labs (all labs ordered are listed, but only abnormal results are displayed) Labs Reviewed - No data to display  EKG  EKG Interpretation None       Radiology No results found.  Procedures Procedures (including critical care time)  Medications Ordered in ED Medications -  No data to display   Initial Impression / Assessment and Plan / ED Course  I have reviewed the triage vital signs and the nursing notes.  Pertinent labs & imaging results that were available during my care of the patient were reviewed by me and considered in my medical decision making (see chart for details).  Clinically this appears to be gout. He will be treated with colchicine, pain medicine, and follow-up as needed.  Final Clinical Impressions(s) / ED Diagnoses   Final diagnoses:  None    New Prescriptions New Prescriptions   No medications on file     Veryl Speak, MD 08/17/16 1348

## 2016-08-17 NOTE — ED Notes (Signed)
ED Provider at bedside. 

## 2016-08-17 NOTE — Discharge Instructions (Signed)
Colchicine as prescribed.  Percocet as prescribed as needed for pain.  Follow up with your primary Dr. if not improving in the next week.

## 2019-01-07 ENCOUNTER — Ambulatory Visit (INDEPENDENT_AMBULATORY_CARE_PROVIDER_SITE_OTHER): Payer: Self-pay

## 2019-01-07 ENCOUNTER — Other Ambulatory Visit: Payer: Self-pay

## 2019-01-07 ENCOUNTER — Ambulatory Visit
Admission: EM | Admit: 2019-01-07 | Discharge: 2019-01-07 | Disposition: A | Discharge status: Home / Self Care | Payer: Self-pay | Attending: Family Medicine | Admitting: Family Medicine

## 2019-01-07 DIAGNOSIS — R509 Fever, unspecified: Secondary | ICD-10-CM

## 2019-01-07 DIAGNOSIS — J029 Acute pharyngitis, unspecified: Secondary | ICD-10-CM

## 2019-01-07 DIAGNOSIS — R05 Cough: Secondary | ICD-10-CM

## 2019-01-07 DIAGNOSIS — R059 Cough, unspecified: Secondary | ICD-10-CM

## 2019-01-07 HISTORY — DX: Malignant melanoma of skin, unspecified: C43.9

## 2019-01-07 MED ORDER — BENZONATATE 200 MG PO CAPS: 200.0000 mg | ORAL_CAPSULE | Freq: Three times a day (TID) | ORAL | 0 refills | Status: DC | PRN

## 2019-01-07 NOTE — ED Provider Notes (Signed)
[facility]    CSN: [id number] Arrival date & time: 01/07/19  1806   History   Chief Complaint Chief Complaint  Patient presents with  . Cough  . Fever   HPI  48 year old male presents with the above complaints.  Patient is currently undergoing immunotherapy for malignant melanoma.  Patient reports that he developed symptoms on Friday.  He reports fever, T-max 101.5.  This has since resolved.  He reports ongoing cough and body aches.  He states the body aches have improved.  He is also had sore throat which is also improved.  Continues to have cough.  He is currently afebrile.  He states that one of his children has been sick as well as his wife.  They have similar symptoms.  He denies any other sick contacts.  No medications or interventions tried.  No other associated symptoms.  No other complaints.  PMH, Surgical Hx, Family Hx, Social History reviewed and updated as below.  Past Medical History:  Diagnosis Date  . Deviated septum   . Hyperlipidemia   . Melanoma ([place])   . Obese   . Observation for suspected cardiovascular disease   . Other dyspnea and respiratory abnormality   . Sleep disturbance, unspecified    Past Surgical History:  Procedure Laterality Date  . ADENOIDECTOMY  02/28/2012   Procedure: ADENOIDECTOMY;  Surgeon: Ascencion Dike, MD;  Location: [place];  Service: ENT;  Laterality: N/A;  . FRACTURE SURGERY    . KNEE SURGERY    . NASAL SEPTOPLASTY W/ TURBINOPLASTY  02/28/2012   Procedure: NASAL SEPTOPLASTY WITH TURBINATE REDUCTION;  Surgeon: Ascencion Dike, MD;  Location: [place];  Service: ENT;  Laterality: Bilateral;  SEPTOPLASTY AND BILATERAL TURBINATE RESECTION    . TONSILLECTOMY    . TURBINATE RESECTION  03/01/2011   Home Medications    Prior to Admission medications   Medication Sig Start Date End Date Taking? Authorizing Provider  Nivolumab (OPDIVO IV) Inject into the vein.   Yes [provider]  benzonatate (TESSALON) 200 MG capsule Take  1 capsule (200 mg total) by mouth 3 (three) times daily as needed for cough. 01/07/19   Coral Spikes, DO  colchicine 0.6 MG tablet Take 0.6mg  (one tablet) by mouth every 1-2 hours until one of the following occurs: 1.  The pain is gone 2.  The maximum dose has been given ( no more than 3 tabs in 3 hours or 10 tabs in 24 hours) 3.  The side effects outweight the benefits 08/17/16   Pisciotta, Elmyra Ricks, PA-C    Family History Family History  Problem Relation Age of Onset  . Heart disease Father   . Heart disease Paternal Grandfather     Social History Social History   Tobacco Use  . Smoking status: Former Smoker    Quit date: 02/29/1996    Years since quitting: 22.8  . Smokeless tobacco: Never Used  Substance Use Topics  . Alcohol use: Yes    Comment: OCCASIONAL  . Drug use: No     Allergies   Tramadol and Vicodin [hydrocodone-acetaminophen]   Review of Systems Review of Systems  Constitutional: Positive for fever.  HENT: Positive for sore throat.   Respiratory: Positive for cough.   Musculoskeletal:       Body aches.   Physical Exam Triage Vital Signs ED Triage Vitals  Enc Vitals Group     BP 01/07/19 1818 (!) 130/93     Pulse Rate 01/07/19 1818 92  Resp 01/07/19 1818 20     Temp 01/07/19 1818 99.3 F (37.4 C)     Temp Source 01/07/19 1818 Oral     SpO2 01/07/19 1818 95 %     Weight 01/07/19 1820 (!) 345 lb (156.5 kg)     Height 01/07/19 1820 6\' 2"  (1.88 m)     Head Circumference --      Peak Flow --      Pain Score 01/07/19 1820 0     Pain Loc --      Pain Edu? --      Excl. in [place]? --    Updated Vital Signs BP (!) 130/93 (BP Location: Right Arm)   Pulse 92   Temp 99.3 F (37.4 C) (Oral)   Resp 20   Ht 6\' 2"  (1.88 m)   Wt (!) 156.5 kg   SpO2 95%   BMI 44.30 kg/m   Visual Acuity Right Eye Distance:   Left Eye Distance:   Bilateral Distance:    Right Eye Near:   Left Eye Near:    Bilateral Near:     Physical Exam Vitals signs and nursing  note reviewed.  Constitutional:      General: He is not in acute distress.    Appearance: Normal appearance. He is obese. He is not ill-appearing.  HENT:     Head: Normocephalic and atraumatic.     Mouth/Throat:     Pharynx: Posterior oropharyngeal erythema present. No oropharyngeal exudate.  Eyes:     General:        Right eye: No discharge.        Left eye: No discharge.     Conjunctiva/sclera: Conjunctivae normal.  Cardiovascular:     Rate and Rhythm: Normal rate and regular rhythm.  Pulmonary:     Effort: Pulmonary effort is normal.     Breath sounds: Normal breath sounds. No wheezing or rales.  Neurological:     Mental Status: He is alert.  Psychiatric:        Mood and Affect: Mood normal.        Behavior: Behavior normal.    UC Treatments / Results  Labs (all labs ordered are listed, but only abnormal results are displayed) Labs Reviewed  NOVEL CORONAVIRUS, NAA (HOSPITAL ORDER, SEND-OUT TO REF LAB)    EKG   Radiology Dg Chest 2 View  Result Date: 01/07/2019 CLINICAL DATA:  Cough EXAM: CHEST - 2 VIEW COMPARISON:  01/21/2008 FINDINGS: Stable mild cardiomegaly. Both lungs are clear. The visualized skeletal structures are unremarkable. IMPRESSION: No active cardiopulmonary disease. Electronically Signed   By: Donavan Foil M.D.   On: 01/07/2019 19:08    Procedures Procedures (including critical care time)  Medications Ordered in UC Medications - No data to display  Initial Impression / Assessment and Plan / UC Course  I have reviewed the triage vital signs and the nursing notes.  Pertinent labs & imaging results that were available during my care of the patient were reviewed by me and considered in my medical decision making (see chart for details).    48 year old male presents with cough. Chest xray negative. Currently afebrile. Awaiting COVID test results. Tessalon perles for cough.  Final Clinical Impressions(s) / UC Diagnoses   Final diagnoses:  Cough      Discharge Instructions     Medication as prescribed.  Take care  Dr. Lacinda Axon    ED Prescriptions    Medication Sig Dispense Auth. Provider   benzonatate (  TESSALON) 200 MG capsule Take 1 capsule (200 mg total) by mouth 3 (three) times daily as needed for cough. 30 capsule Coral Spikes, DO     PDMP not reviewed this encounter.   Coral Spikes, [place] 01/07/19 2106

## 2019-01-07 NOTE — ED Triage Notes (Addendum)
Pt states fever on Friday (resolved), coughing which has not improved, muscle soreness. Pt undergoing immunotherapy for Stage 3 Melanoma

## 2019-01-07 NOTE — Discharge Instructions (Signed)
Medication as prescribed.  Take care  Dr. Kelby Adell  

## 2019-01-09 LAB — NOVEL CORONAVIRUS, NAA (HOSP ORDER, SEND-OUT TO REF LAB; TAT 18-24 HRS): SARS-CoV-2, NAA: NOT DETECTED

## 2020-09-02 ENCOUNTER — Emergency Department (HOSPITAL_COMMUNITY)
Admission: EM | Admit: 2020-09-02 | Discharge: 2020-09-02 | Disposition: A | Discharge status: Home / Self Care | Payer: Medicaid Other | Attending: Emergency Medicine | Admitting: Emergency Medicine

## 2020-09-02 ENCOUNTER — Encounter (HOSPITAL_COMMUNITY): Payer: Self-pay | Admitting: Emergency Medicine

## 2020-09-02 ENCOUNTER — Other Ambulatory Visit: Payer: Self-pay

## 2020-09-02 DIAGNOSIS — M546 Pain in thoracic spine: Secondary | ICD-10-CM | POA: Insufficient documentation

## 2020-09-02 DIAGNOSIS — Z5321 Procedure and treatment not carried out due to patient leaving prior to being seen by health care provider: Secondary | ICD-10-CM | POA: Insufficient documentation

## 2020-09-02 LAB — URINALYSIS, ROUTINE W REFLEX MICROSCOPIC
Bilirubin Urine: NEGATIVE
Glucose, UA: NEGATIVE mg/dL
Hgb urine dipstick: NEGATIVE
Ketones, ur: NEGATIVE mg/dL
Leukocytes,Ua: NEGATIVE
Nitrite: NEGATIVE
Protein, ur: NEGATIVE mg/dL
Specific Gravity, Urine: 1.021 (ref 1.005–1.030)
pH: 5 (ref 5.0–8.0)

## 2020-09-02 LAB — CBC WITH DIFFERENTIAL/PLATELET
Abs Immature Granulocytes: 0.04 10*3/uL (ref 0.00–0.07)
Basophils Absolute: 0 10*3/uL (ref 0.0–0.1)
Basophils Relative: 1 %
Eosinophils Absolute: 0.1 10*3/uL (ref 0.0–0.5)
Eosinophils Relative: 2 %
HCT: 41.7 % (ref 39.0–52.0)
Hemoglobin: 13.8 g/dL (ref 13.0–17.0)
Immature Granulocytes: 1 %
Lymphocytes Relative: 22 %
Lymphs Abs: 1.9 10*3/uL (ref 0.7–4.0)
MCH: 28.9 pg (ref 26.0–34.0)
MCHC: 33.1 g/dL (ref 30.0–36.0)
MCV: 87.2 fL (ref 80.0–100.0)
Monocytes Absolute: 0.8 10*3/uL (ref 0.1–1.0)
Monocytes Relative: 9 %
Neutro Abs: 5.6 10*3/uL (ref 1.7–7.7)
Neutrophils Relative %: 65 %
Platelets: 295 10*3/uL (ref 150–400)
RBC: 4.78 MIL/uL (ref 4.22–5.81)
RDW: 13.3 % (ref 11.5–15.5)
WBC: 8.4 10*3/uL (ref 4.0–10.5)
nRBC: 0 % (ref 0.0–0.2)

## 2020-09-02 LAB — BASIC METABOLIC PANEL
Anion gap: 10 (ref 5–15)
BUN: 11 mg/dL (ref 6–20)
CO2: 24 mmol/L (ref 22–32)
Calcium: 10.6 mg/dL — ABNORMAL HIGH (ref 8.9–10.3)
Chloride: 101 mmol/L (ref 98–111)
Creatinine, Ser: 1.17 mg/dL (ref 0.61–1.24)
GFR, Estimated: >60 mL/min (ref >60)
Glucose, Bld: 121 mg/dL — ABNORMAL HIGH (ref 70–99)
Potassium: 3.9 mmol/L (ref 3.5–5.1)
Sodium: 135 mmol/L (ref 135–145)

## 2020-09-02 MED ORDER — ONDANSETRON 4 MG PO TBDP
4.0000 mg | ORAL_TABLET | Freq: Once | ORAL | Status: AC
  Administered 2020-09-02: 4 mg via ORAL
  Filled 2020-09-02: qty 1

## 2020-09-02 MED ORDER — OXYCODONE-ACETAMINOPHEN 5-325 MG PO TABS
1.0000 | ORAL_TABLET | Freq: Once | ORAL | Status: AC
  Administered 2020-09-02: 1 via ORAL
  Filled 2020-09-02: qty 1

## 2020-09-02 NOTE — ED Notes (Signed)
Pt told someone he was leaving and I dont see pt outside on in lobby

## 2020-09-02 NOTE — ED Triage Notes (Signed)
Patient arrived with EMS from home reports increasing pain at mid/right back onset 2 days ago unrelieved by prescription hydrocodone , denies fall or injury , no urinary discomfort .

## 2020-09-02 NOTE — ED Provider Notes (Signed)
Emergency Medicine Provider Triage Evaluation Note  Richard Byrd. , a 50 y.o. male  was evaluated in triage.  Pt complains of upper back pain.  States woke up from sleep and felt like there was a "crick" in his back.  Now it feels like something is stabbing him whenever he moves. Hx of chronic back pain, usually lower though. Also reports thinking he pulled groin muscle on right side recently but questions if it is a lymph node now.  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  Last skin check 1 year ago, due to see oncology soon.  Denies urinary symptoms or abdominal pain.  Took hydrocodone at home without change.  Review of Systems  Positive: Back pain Negative: Urinary symptoms  Physical Exam  BP (!) 134/97   Pulse (!) 106   Temp 98.4 F (36.9 C) (Oral)   Resp 16   Ht 6\' 2"  (1.88 m)   Wt (!) 175 kg   SpO2 97%   BMI 49.53 kg/m   Gen:   Awake, no distress   Resp:  Normal effort  MSK:   Moves extremities without difficulty Other:  Tenderness to upper back between the shoulder blades without bony deformity or step-off  Medical Decision Making  Medically screening exam initiated at 3:19 AM.  Appropriate orders placed.  Son Juanetta Snow. was informed that the remainder of the evaluation will be completed by another provider, this initial triage assessment does not replace that evaluation, and the importance of remaining in the ED until their evaluation is complete.  Will check labs, UA.  Back pain seems MSK.   Larene Pickett, PA-C 09/02/20 0325    Fatima Blank, MD 09/02/20 [phone]

## 2021-04-14 ENCOUNTER — Other Ambulatory Visit: Payer: Self-pay

## 2021-04-14 ENCOUNTER — Encounter (HOSPITAL_COMMUNITY): Payer: Self-pay

## 2021-04-14 ENCOUNTER — Emergency Department (HOSPITAL_COMMUNITY)
Admission: EM | Admit: 2021-04-14 | Discharge: 2021-04-14 | Disposition: A | Discharge status: Home / Self Care | Payer: Medicaid Other | Attending: Physician Assistant | Admitting: Physician Assistant

## 2021-04-14 DIAGNOSIS — R110 Nausea: Secondary | ICD-10-CM | POA: Diagnosis not present

## 2021-04-14 DIAGNOSIS — R1011 Right upper quadrant pain: Secondary | ICD-10-CM | POA: Diagnosis not present

## 2021-04-14 DIAGNOSIS — K5900 Constipation, unspecified: Secondary | ICD-10-CM | POA: Diagnosis not present

## 2021-04-14 DIAGNOSIS — Z5321 Procedure and treatment not carried out due to patient leaving prior to being seen by health care provider: Secondary | ICD-10-CM | POA: Diagnosis not present

## 2021-04-14 MED ORDER — HYDROCODONE-ACETAMINOPHEN 5-325 MG PO TABS
1.0000 | ORAL_TABLET | Freq: Once | ORAL | Status: DC
  Filled 2021-04-14: qty 1

## 2021-04-14 MED ORDER — ONDANSETRON 4 MG PO TBDP
4.0000 mg | ORAL_TABLET | Freq: Once | ORAL | Status: DC
  Filled 2021-04-14: qty 1

## 2021-04-14 NOTE — ED Triage Notes (Signed)
Pt BIB [facility] c/o RUQ abdominal pain that started while he was out to dinner with frequent belching. Pt also endorses constipation x3 days d/t chronic pain meds.

## 2021-04-14 NOTE — ED Notes (Signed)
Pt called for registration as well as vitals check,m X4, no response

## 2021-04-14 NOTE — ED Provider Triage Note (Signed)
Emergency Medicine Provider Triage Evaluation Note  Richard Byrd. , a 51 y.o. male  was evaluated in triage.  Pt complains of abd pain and nausea while out eating dinner. Hx of metastatic melanoma  with mets  Review of Systems  Positive: Abd pain, nausea  Negative: Back pain, weakness  Physical Exam  There were no vitals taken for this visit. Gen:   Awake, no distress   Resp:  Normal effort  ABD:  Diffuse tenderness MSK:   Moves extremities without difficulty  Other:    Medical Decision Making  Medically screening exam initiated at 6:27 PM.  Appropriate orders placed.  Richard Byrd. was informed that the remainder of the evaluation will be completed by another provider, this initial triage assessment does not replace that evaluation, and the importance of remaining in the ED until their evaluation is complete.  Abd pain, nausea   Tsuyako Jolley A, PA-C 04/14/21 1831

## 2021-12-05 ENCOUNTER — Emergency Department (HOSPITAL_COMMUNITY): Payer: Medicaid Other

## 2021-12-05 ENCOUNTER — Encounter (HOSPITAL_COMMUNITY): Payer: Self-pay | Admitting: Emergency Medicine

## 2021-12-05 ENCOUNTER — Inpatient Hospital Stay (HOSPITAL_COMMUNITY): Payer: Medicaid Other

## 2021-12-05 ENCOUNTER — Other Ambulatory Visit: Payer: Self-pay

## 2021-12-05 ENCOUNTER — Inpatient Hospital Stay (HOSPITAL_COMMUNITY)
Admission: EM | Admit: 2021-12-05 | Discharge: 2021-12-08 | DRG: 394 | Disposition: A | Discharge status: Home / Self Care | Payer: Medicaid Other | Attending: Internal Medicine | Admitting: Internal Medicine

## 2021-12-05 DIAGNOSIS — I82409 Acute embolism and thrombosis of unspecified deep veins of unspecified lower extremity: Secondary | ICD-10-CM | POA: Diagnosis not present

## 2021-12-05 DIAGNOSIS — E861 Hypovolemia: Secondary | ICD-10-CM | POA: Diagnosis present

## 2021-12-05 DIAGNOSIS — W1830XA Fall on same level, unspecified, initial encounter: Secondary | ICD-10-CM | POA: Diagnosis present

## 2021-12-05 DIAGNOSIS — Z85820 Personal history of malignant melanoma of skin: Secondary | ICD-10-CM | POA: Diagnosis not present

## 2021-12-05 DIAGNOSIS — K2970 Gastritis, unspecified, without bleeding: Secondary | ICD-10-CM | POA: Diagnosis not present

## 2021-12-05 DIAGNOSIS — D6869 Other thrombophilia: Secondary | ICD-10-CM | POA: Diagnosis present

## 2021-12-05 DIAGNOSIS — Z923 Personal history of irradiation: Secondary | ICD-10-CM | POA: Diagnosis not present

## 2021-12-05 DIAGNOSIS — K921 Melena: Secondary | ICD-10-CM | POA: Diagnosis not present

## 2021-12-05 DIAGNOSIS — I82412 Acute embolism and thrombosis of left femoral vein: Secondary | ICD-10-CM | POA: Diagnosis present

## 2021-12-05 DIAGNOSIS — D500 Iron deficiency anemia secondary to blood loss (chronic): Secondary | ICD-10-CM | POA: Diagnosis not present

## 2021-12-05 DIAGNOSIS — R195 Other fecal abnormalities: Secondary | ICD-10-CM | POA: Diagnosis present

## 2021-12-05 DIAGNOSIS — D62 Acute posthemorrhagic anemia: Secondary | ICD-10-CM | POA: Diagnosis present

## 2021-12-05 DIAGNOSIS — K602 Anal fissure, unspecified: Secondary | ICD-10-CM | POA: Diagnosis not present

## 2021-12-05 DIAGNOSIS — K2100 Gastro-esophageal reflux disease with esophagitis, without bleeding: Secondary | ICD-10-CM | POA: Diagnosis present

## 2021-12-05 DIAGNOSIS — E872 Acidosis, unspecified: Secondary | ICD-10-CM | POA: Diagnosis present

## 2021-12-05 DIAGNOSIS — E669 Obesity, unspecified: Secondary | ICD-10-CM | POA: Diagnosis present

## 2021-12-05 DIAGNOSIS — C771 Secondary and unspecified malignant neoplasm of intrathoracic lymph nodes: Secondary | ICD-10-CM | POA: Diagnosis present

## 2021-12-05 DIAGNOSIS — Z79899 Other long term (current) drug therapy: Secondary | ICD-10-CM

## 2021-12-05 DIAGNOSIS — D649 Anemia, unspecified: Secondary | ICD-10-CM | POA: Diagnosis not present

## 2021-12-05 DIAGNOSIS — Z1152 Encounter for screening for COVID-19: Secondary | ICD-10-CM

## 2021-12-05 DIAGNOSIS — E785 Hyperlipidemia, unspecified: Secondary | ICD-10-CM | POA: Diagnosis present

## 2021-12-05 DIAGNOSIS — Z87891 Personal history of nicotine dependence: Secondary | ICD-10-CM | POA: Diagnosis not present

## 2021-12-05 DIAGNOSIS — C787 Secondary malignant neoplasm of liver and intrahepatic bile duct: Secondary | ICD-10-CM | POA: Diagnosis present

## 2021-12-05 DIAGNOSIS — K2270 Barrett's esophagus without dysplasia: Secondary | ICD-10-CM | POA: Diagnosis present

## 2021-12-05 DIAGNOSIS — C439 Malignant melanoma of skin, unspecified: Secondary | ICD-10-CM | POA: Diagnosis present

## 2021-12-05 DIAGNOSIS — Z6834 Body mass index (BMI) 34.0-34.9, adult: Secondary | ICD-10-CM

## 2021-12-05 DIAGNOSIS — K259 Gastric ulcer, unspecified as acute or chronic, without hemorrhage or perforation: Secondary | ICD-10-CM | POA: Diagnosis present

## 2021-12-05 DIAGNOSIS — Z9221 Personal history of antineoplastic chemotherapy: Secondary | ICD-10-CM | POA: Diagnosis not present

## 2021-12-05 DIAGNOSIS — K625 Hemorrhage of anus and rectum: Secondary | ICD-10-CM | POA: Diagnosis not present

## 2021-12-05 DIAGNOSIS — K5520 Angiodysplasia of colon without hemorrhage: Secondary | ICD-10-CM | POA: Diagnosis not present

## 2021-12-05 DIAGNOSIS — C7802 Secondary malignant neoplasm of left lung: Secondary | ICD-10-CM | POA: Diagnosis present

## 2021-12-05 DIAGNOSIS — K922 Gastrointestinal hemorrhage, unspecified: Secondary | ICD-10-CM | POA: Diagnosis not present

## 2021-12-05 DIAGNOSIS — Z885 Allergy status to narcotic agent status: Secondary | ICD-10-CM

## 2021-12-05 DIAGNOSIS — E860 Dehydration: Secondary | ICD-10-CM | POA: Diagnosis present

## 2021-12-05 DIAGNOSIS — K253 Acute gastric ulcer without hemorrhage or perforation: Secondary | ICD-10-CM

## 2021-12-05 DIAGNOSIS — Z7901 Long term (current) use of anticoagulants: Secondary | ICD-10-CM

## 2021-12-05 DIAGNOSIS — C7951 Secondary malignant neoplasm of bone: Secondary | ICD-10-CM | POA: Diagnosis present

## 2021-12-05 DIAGNOSIS — Y842 Radiological procedure and radiotherapy as the cause of abnormal reaction of the patient, or of later complication, without mention of misadventure at the time of the procedure: Secondary | ICD-10-CM | POA: Diagnosis present

## 2021-12-05 DIAGNOSIS — K520 Gastroenteritis and colitis due to radiation: Principal | ICD-10-CM

## 2021-12-05 DIAGNOSIS — N179 Acute kidney failure, unspecified: Secondary | ICD-10-CM | POA: Diagnosis present

## 2021-12-05 DIAGNOSIS — K648 Other hemorrhoids: Secondary | ICD-10-CM | POA: Diagnosis present

## 2021-12-05 DIAGNOSIS — C7801 Secondary malignant neoplasm of right lung: Secondary | ICD-10-CM | POA: Diagnosis present

## 2021-12-05 DIAGNOSIS — K5730 Diverticulosis of large intestine without perforation or abscess without bleeding: Secondary | ICD-10-CM | POA: Diagnosis present

## 2021-12-05 DIAGNOSIS — R7989 Other specified abnormal findings of blood chemistry: Secondary | ICD-10-CM | POA: Diagnosis present

## 2021-12-05 DIAGNOSIS — K449 Diaphragmatic hernia without obstruction or gangrene: Secondary | ICD-10-CM | POA: Diagnosis present

## 2021-12-05 DIAGNOSIS — Z79891 Long term (current) use of opiate analgesic: Secondary | ICD-10-CM

## 2021-12-05 DIAGNOSIS — K209 Esophagitis, unspecified without bleeding: Secondary | ICD-10-CM | POA: Diagnosis not present

## 2021-12-05 DIAGNOSIS — Z8249 Family history of ischemic heart disease and other diseases of the circulatory system: Secondary | ICD-10-CM

## 2021-12-05 LAB — COMPREHENSIVE METABOLIC PANEL
ALT: 22 U/L (ref 0–44)
AST: 35 U/L (ref 15–41)
Albumin: 2.3 g/dL — ABNORMAL LOW (ref 3.5–5.0)
Alkaline Phosphatase: 114 U/L (ref 38–126)
Anion gap: 16 — ABNORMAL HIGH (ref 5–15)
BUN: 12 mg/dL (ref 6–20)
CO2: 12 mmol/L — ABNORMAL LOW (ref 22–32)
Calcium: 9.7 mg/dL (ref 8.9–10.3)
Chloride: 110 mmol/L (ref 98–111)
Creatinine, Ser: 1.43 mg/dL — ABNORMAL HIGH (ref 0.61–1.24)
GFR, Estimated: >60 mL/min (ref >60)
Glucose, Bld: 242 mg/dL — ABNORMAL HIGH (ref 70–99)
Potassium: 4.3 mmol/L (ref 3.5–5.1)
Sodium: 138 mmol/L (ref 135–145)
Total Bilirubin: 0.5 mg/dL (ref 0.3–1.2)
Total Protein: 5.8 g/dL — ABNORMAL LOW (ref 6.5–8.1)

## 2021-12-05 LAB — URINALYSIS, ROUTINE W REFLEX MICROSCOPIC
Bilirubin Urine: NEGATIVE
Glucose, UA: NEGATIVE mg/dL
Hgb urine dipstick: NEGATIVE
Ketones, ur: NEGATIVE mg/dL
Leukocytes,Ua: NEGATIVE
Nitrite: NEGATIVE
Protein, ur: 30 mg/dL — AB
Specific Gravity, Urine: 1.029 (ref 1.005–1.030)
pH: 5 (ref 5.0–8.0)

## 2021-12-05 LAB — HEMOGLOBIN AND HEMATOCRIT, BLOOD
HCT: 22.7 % — ABNORMAL LOW (ref 39.0–52.0)
Hemoglobin: 7.1 g/dL — ABNORMAL LOW (ref 13.0–17.0)

## 2021-12-05 LAB — CBC WITH DIFFERENTIAL/PLATELET
Abs Immature Granulocytes: 0.33 10*3/uL — ABNORMAL HIGH (ref 0.00–0.07)
Basophils Absolute: 0.1 10*3/uL (ref 0.0–0.1)
Basophils Relative: 0 %
Eosinophils Absolute: 0 10*3/uL (ref 0.0–0.5)
Eosinophils Relative: 0 %
HCT: 26.5 % — ABNORMAL LOW (ref 39.0–52.0)
Hemoglobin: 7.7 g/dL — ABNORMAL LOW (ref 13.0–17.0)
Immature Granulocytes: 2 %
Lymphocytes Relative: 7 %
Lymphs Abs: 1 10*3/uL (ref 0.7–4.0)
MCH: 26.7 pg (ref 26.0–34.0)
MCHC: 29.1 g/dL — ABNORMAL LOW (ref 30.0–36.0)
MCV: 92 fL (ref 80.0–100.0)
Monocytes Absolute: 0.9 10*3/uL (ref 0.1–1.0)
Monocytes Relative: 6 %
Neutro Abs: 11.9 10*3/uL — ABNORMAL HIGH (ref 1.7–7.7)
Neutrophils Relative %: 85 %
Platelets: 596 10*3/uL — ABNORMAL HIGH (ref 150–400)
RBC: 2.88 MIL/uL — ABNORMAL LOW (ref 4.22–5.81)
RDW: 17.2 % — ABNORMAL HIGH (ref 11.5–15.5)
WBC: 14.1 10*3/uL — ABNORMAL HIGH (ref 4.0–10.5)
nRBC: 0.2 % (ref 0.0–0.2)

## 2021-12-05 LAB — LACTIC ACID, PLASMA: Lactic Acid, Venous: 1.3 mmol/L (ref 0.5–1.9)

## 2021-12-05 LAB — CBC
HCT: 19.9 % — ABNORMAL LOW (ref 39.0–52.0)
Hemoglobin: 6.4 g/dL — CL (ref 13.0–17.0)
MCH: 27.9 pg (ref 26.0–34.0)
MCHC: 32.2 g/dL (ref 30.0–36.0)
MCV: 86.9 fL (ref 80.0–100.0)
Platelets: 335 10*3/uL (ref 150–400)
RBC: 2.29 MIL/uL — ABNORMAL LOW (ref 4.22–5.81)
RDW: 17 % — ABNORMAL HIGH (ref 11.5–15.5)
WBC: 6.1 10*3/uL (ref 4.0–10.5)
nRBC: 0.3 % — ABNORMAL HIGH (ref 0.0–0.2)

## 2021-12-05 LAB — ABO/RH: ABO/RH(D): A POS

## 2021-12-05 LAB — RESP PANEL BY RT-PCR (FLU A&B, COVID) ARPGX2
Influenza A by PCR: NEGATIVE
Influenza B by PCR: NEGATIVE
SARS Coronavirus 2 by RT PCR: NEGATIVE

## 2021-12-05 LAB — TROPONIN I (HIGH SENSITIVITY)
Troponin I (High Sensitivity): 63 ng/L — ABNORMAL HIGH (ref <18)
Troponin I (High Sensitivity): 110 ng/L (ref <18)
Troponin I (High Sensitivity): 108 ng/L (ref <18)

## 2021-12-05 LAB — BRAIN NATRIURETIC PEPTIDE: B Natriuretic Peptide: 80.2 pg/mL (ref 0.0–100.0)

## 2021-12-05 LAB — PREPARE RBC (CROSSMATCH)

## 2021-12-05 LAB — HIV ANTIBODY (ROUTINE TESTING W REFLEX): HIV Screen 4th Generation wRfx: NONREACTIVE

## 2021-12-05 LAB — CORTISOL: Cortisol, Plasma: 28.1 ug/dL

## 2021-12-05 MED ORDER — SENNA 8.6 MG PO TABS: 1.0000 | ORAL_TABLET | Freq: Two times a day (BID) | ORAL | Status: DC

## 2021-12-05 MED ORDER — HYDROMORPHONE HCL 2 MG PO TABS
6.0000 mg | ORAL_TABLET | Freq: Once | ORAL | Status: AC
  Administered 2021-12-05: 6 mg via ORAL
  Filled 2021-12-05: qty 3

## 2021-12-05 MED ORDER — POLYETHYLENE GLYCOL 3350 17 G PO PACK: 17.0000 g | PACK | Freq: Every day | ORAL | Status: DC | PRN

## 2021-12-05 MED ORDER — OLANZAPINE 5 MG PO TABS
5.0000 mg | ORAL_TABLET | Freq: Every day | ORAL | Status: DC
  Administered 2021-12-05 – 2021-12-07 (×3): 5 mg via ORAL
  Filled 2021-12-05 (×3): qty 1

## 2021-12-05 MED ORDER — SODIUM CHLORIDE 0.9 % IV BOLUS (SEPSIS)
1000.0000 mL | Freq: Once | INTRAVENOUS | Status: AC
  Administered 2021-12-05: 1000 mL via INTRAVENOUS

## 2021-12-05 MED ORDER — HYDROMORPHONE HCL 2 MG PO TABS
6.0000 mg | ORAL_TABLET | ORAL | Status: DC
  Administered 2021-12-05 – 2021-12-08 (×17): 6 mg via ORAL
  Filled 2021-12-05 (×18): qty 3

## 2021-12-05 MED ORDER — ACETAMINOPHEN 650 MG RE SUPP: 650.0000 mg | Freq: Four times a day (QID) | RECTAL | Status: DC | PRN

## 2021-12-05 MED ORDER — SODIUM CHLORIDE 0.9 % IV SOLN
1000.0000 mL | INTRAVENOUS | Status: AC
  Administered 2021-12-05: 1000 mL via INTRAVENOUS

## 2021-12-05 MED ORDER — BISACODYL 10 MG RE SUPP: 10.0000 mg | Freq: Every day | RECTAL | Status: DC | PRN

## 2021-12-05 MED ORDER — METHADONE HCL 10 MG PO TABS
10.0000 mg | ORAL_TABLET | Freq: Three times a day (TID) | ORAL | Status: DC
  Administered 2021-12-05 – 2021-12-08 (×10): 10 mg via ORAL
  Filled 2021-12-05 (×10): qty 1

## 2021-12-05 MED ORDER — SODIUM CHLORIDE 0.9% IV SOLUTION: Freq: Once | INTRAVENOUS | Status: AC

## 2021-12-05 MED ORDER — SODIUM CHLORIDE 0.9 % IV SOLN
10.0000 mL/h | Freq: Once | INTRAVENOUS | Status: AC
  Administered 2021-12-06: 10 mL/h via INTRAVENOUS

## 2021-12-05 MED ORDER — ACETAMINOPHEN 325 MG PO TABS
650.0000 mg | ORAL_TABLET | Freq: Four times a day (QID) | ORAL | Status: DC | PRN
  Administered 2021-12-05 – 2021-12-06 (×3): 650 mg via ORAL
  Filled 2021-12-05 (×3): qty 2

## 2021-12-05 MED ORDER — PREGABALIN 75 MG PO CAPS
75.0000 mg | ORAL_CAPSULE | Freq: Two times a day (BID) | ORAL | Status: DC
  Administered 2021-12-05 – 2021-12-08 (×6): 75 mg via ORAL
  Filled 2021-12-05 (×3): qty 1
  Filled 2021-12-05: qty 3
  Filled 2021-12-05 (×2): qty 1

## 2021-12-05 MED ORDER — IOHEXOL 350 MG/ML SOLN
75.0000 mL | Freq: Once | INTRAVENOUS | Status: AC | PRN
  Administered 2021-12-05: 75 mL via INTRAVENOUS

## 2021-12-05 NOTE — ED Provider Notes (Signed)
Patient signed out to me at 0700 by Dr. Leonette Monarch pending CT PE with plan for admission.  In short this is a 51 year old male with a past medical history of metastatic melanoma receiving radiation therapy and DVT on Eliquis that presented to the emergency department with shortness of breath and generalized weakness.  He was initially tachycardic and hypotensive on arrival and improved with IV fluids.  He was found to be anemic with a hemoglobin of 7.7 from recent hemoglobin of 9.7 and is being transfused 1 unit of PRBCs.  He did have guaiac positive brown stool.  Upon my evaluation, the patient is awake and alert resting in bed complaining of left shoulder pain.  Heart is regular rate and rhythm, lungs are clear to auscultation bilaterally, he is tender to palpation over left scapula with no obvious deformity.  He states that he takes 6 mg of p.o. Dilaudid every 4 hours as needed for pain and he will be given a dose of his home pain medication here.  He is pending his CT PE study at this time and will require admission.   Leanord Asal K, DO 12/05/21 0745

## 2021-12-05 NOTE — ED Notes (Signed)
Provider paged about critical hgb of 6.4

## 2021-12-05 NOTE — Hospital Course (Signed)
Follow up items: - PPI for ulcer - Repeat iron studies   10-11: Patient reports feeling fine this morning. Denies lightheadedness, dizziness, hematochezia, melena, active bleeding, and any syncopal episodes. Discussed CT scan results and high iron levels identified on laboratory testing. Plan for discharge later today, patient expressed agreement.

## 2021-12-05 NOTE — Progress Notes (Deleted)
Date: 12/05/2021              Patient Name:  Richard Byrd. MRN: [id number]  DOB: 22-Oct-1970 Age / Sex: 51 y.o., male   PCP: Pcp, No         Medical Service: Internal Medicine Teaching Service         Attending Physician: Dr. Aldine Contes    First Contact: Dr. Carin Primrose Pager: [phone]  Second Contact: Dr. Alfonse Spruce Pager: [phone]       After Hours (After 5p/  First Contact Pager: [phone]  weekends / holidays): Second Contact Pager: [phone]   Chief Complaint: Shortness of breath and weakness  History of Present Illness: This patient is a 51 year old male with a history of metastatic melanoma status post chemoradiation who came to the emergency department by ambulance because of sudden onset weakness and shortness of breath.  He describes waking up around 3 AM because he had to urinate.  This was not unusual.  However when he sat up, he felt very woozy, and promptly fell to the floor.  He did not lose consciousness, but his wife describes a period of confusion and disorientation.  When he attempted to sit up on the ground, he became very short of breath, describing the sensation as "like I got the wind knocked out of me."  This prompted a call to EMS for transport to the emergency department for evaluation.  Patient's recent history is notable for malaise which he attributes to radiation therapy for a cancerous lymph node in his groin.  He has had numerous rounds of radiation and always feels kind of "beat up" afterwards.  He also reports numerous bouts of diarrhea over the last few days, correlating with the timing of his radiation therapy.  Prior to the onset of his diarrhea he had a hemorrhoid that frequently caused some bright red blood in his stool.  At the time of this interview, the patient is feeling more comfortable than he was at onset of his symptoms last night.  His dyspnea has improved.  Review of systems is notable for headache, increased pallor.  The patient  denies chest pain, abdominal pain, new or worsening back pain, focal weakness or sensory changes, change in frequency of urination, difficulty urinating, or dysuria.  Upon arrival to the ED the patient was tachycardic and hypotensive.  EKG showed sinus tachycardia.  CTA of the chest was negative for pulmonary embolism.  Laboratory work-up was notable for hemoglobin of 7.7.  Physical exam was notable for guaiac positive brown stool.  He was treated with IV fluids with some improvement in his symptoms.  Meds: Held overnight pain medicine and morning dose of Eliquis because of acute illness.  Current Meds  Medication Sig   Cholecalciferol (VITAMIN D3) 1.25 MG (50000 UT) CAPS Take 1 capsule by mouth once a week. Sundays   colchicine 0.6 MG tablet Take 0.'6mg'$  (one tablet) by mouth every 1-2 hours until one of the following occurs: 1.  The pain is gone 2.  The maximum dose has been given ( no more than 3 tabs in 3 hours or 10 tabs in 24 hours) 3.  The side effects outweight the benefits   ELIQUIS 5 MG TABS tablet Take 5 mg by mouth 2 (two) times daily.   HYDROmorphone (DILAUDID) 4 MG tablet Take 6 mg by mouth every 4 (four) hours.   lactulose (CHRONULAC) 10 GM/15ML solution Take 10 g by mouth daily as needed for mild  constipation.   LORazepam (ATIVAN) 1 MG tablet Take 1 mg by mouth at bedtime as needed for anxiety.   methadone (DOLOPHINE) 5 MG tablet Take 7.5 mg by mouth daily.   OLANZapine (ZYPREXA) 5 MG tablet Take 5 mg by mouth at bedtime.   ondansetron (ZOFRAN-ODT) 8 MG disintegrating tablet Take 8 mg by mouth every 8 (eight) hours as needed for nausea/vomiting.   pregabalin (LYRICA) 75 MG capsule Take 75 mg by mouth every 12 (twelve) hours.     Allergies: Allergies as of 12/05/2021 - Review Complete 12/05/2021  Allergen Reaction Noted   Tramadol Nausea And Vomiting 02/28/2012   Vicodin [hydrocodone-acetaminophen] Nausea Only 04/18/2011   Past Medical History:  Diagnosis Date   Deviated  septum    Hyperlipidemia    Melanoma ([place])    Obese    Observation for suspected cardiovascular disease    Other dyspnea and respiratory abnormality    Sleep disturbance, unspecified     Family History: Family history of heart disease in father and grandfather.  Social History: Lives at home with wife and children.  Denies alcohol, tobacco, or drug use.  Review of Systems: A complete ROS was negative except as per HPI.   Physical Exam: Blood pressure 111/77, pulse (!) 101, temperature 97.9 F (36.6 C), temperature source Oral, resp. rate 16, height '6\' 2"'$  (1.88 m), weight 120.2 kg, SpO2 97 %. General: Pale appearing male in no apparent acute distress. HEENT: Anicteric sclera. Cardiovascular: Regular tachycardia.  No murmurs. Respiratory: Normal work of breathing.  Lungs clear to auscultation. Abdominal: Soft.  Nontender.  Nondistended. Extremities: Left lower extremity edema. Skin: Pale.  Warm and dry. Neuro: Alert.  No gross focal deficits.  Equal strength bilateral upper and lower extremities. Psych: Pleasant.  Appropriate mood and affect.  EKG: Sinus tachycardia.  CXR: Nodular density behind the heart.  Otherwise no pulmonary edema or consolidation.  CTA chest: No evidence of pulmonary embolism.  Widespread metastatic disease to lungs, pleura, right hilar and mediastinal lymph nodes, and liver.  Sclerotic lesion with mild pathologic compression at T11 and small sclerotic lesion in T3.  Labs: Serum creatinine of 1.43 mg/dL  WBC 14.1 Hemoglobin 7.7 MCV 92  Urine specific gravity 1.045  Assessment & Plan  Richard Byrd. is a 51 y.o. with a history of widely metastatic melanoma who is status post a recent course of palliative radiation therapy who presents with symptomatic anemia and syncope likely due to a combination of gastrointestinal bleeding and diarrhea causing hypovolemia.  Principal Problem:   Symptomatic anemia Active Problems:   Gastrointestinal  bleeding   Metastatic melanoma ([place])   Acute kidney injury ([place])   DVT (deep venous thrombosis) ([facility])  Symptomatic anemia Gastrointestinal bleeding Hemoglobin of 7.7 down from 9-10 11/25/2021.  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  Granted, the tempo of his symptoms do not necessarily match subacute GI hemorrhage, but I think his condition is compounded by diarrhea and hypovolemia.  He has probably been developing anemia for several days and his concomitant fluid losses have combined to cause the clinical syndrome with which he presents to the hospital.  Other causes of the patient's symptoms could be pure hypovolemia.  Low index of suspicion for ACS as the patient is  without chest pain or EKG changes and had symptoms that were dependent on position rather than purely at rest or with exertion.  Suspect mild troponin elevation secondary to severe anemia and tachycardia rather than ACS.  PE has been ruled out via negative CTA chest.  The patient's clinical picture is not suggestive of an infection like pneumonia or viral upper respiratory tract infection. - Request GI consult for evaluation of a presumed lower GI bleed. - Follow-up iron studies and reticulocytes. - Transfuse for hemoglobin less than 7 or for symptoms.  AKI Hypovolemia This patient's history of worsening symptoms dependant on positional changes in the setting of diarrhea that is been ongoing for several days since a course of palliative radiation therapy is strongly suggestive of hypovolemia.  Suspect his AKI is of a prerenal etiology.  This may also be the origin of his anion gap metabolic acidosis.  Initially hypotensive and tachycardic.  Creatinine of 1.43, up from a baseline of around 0.8.   Urinalysis was bland, but with elevated specific gravity.  His symptoms, heart rate, and blood pressure had improved somewhat by the time he was evaluated by the IMTS, at which point he was status post 2 L of IV fluids.  Renal ultrasound obtained to rule out hydronephrosis given history of external compression on ureter from bulky tumor. - Continue IV fluids at 125 mL/h through the evening. - Follow-up lactic acid level. - Patient may eat and drink, but switch to clear liquids at midnight in anticipation of colonoscopy.  DVT Left lower extremity, likely secondary to bulky, cancerous lymph node compressing on the veins and hypercoagulability of malignancy.  Currently holding Eliquis in setting of presumed GI bleed.  Hypercalcemia of malignancy Calcium 9.7 on admission.  Gets Zometa every 3 months.  Pain management Due to bulky cancerous lymph nodes and bone metastases. - Dilaudid 6 mg p.o. every 4 hours - Methadone 10 mg p.o. every 8 hours  Diet:  Clear liquids and NPO after midnight IVF: NS,125cc/hr VTE:  Contraindicated in setting of presumed GI bleeding Code: Full Surrogate: Mil[place], [place], [phone]  Dispo: Admit patient to Inpatient with expected length of stay greater than 2 midnights.  Signed: Nani Gasser MD 12/05/2021, 3:11 PM  Pager: [phone] After 5pm on weekdays and 1pm on weekends: On Call pager: [phone]

## 2021-12-05 NOTE — Consult Note (Signed)
CONSULT NOTE FOR [facility]  Reason for Consult: Anemia, weakness, heme positive stool Referring Physician: [place]. HPI: This is a 51 year old male with widely metastatic melanoma (the original diagnosis of melanoma  was in the left groin on 09/2018 at [facility]), DVT on Eliquis, hyperlipidemia, and obesity admitted for generalized weakness and SOB.  He woke up around 3 AM to use the restroom, but he felt very weak and there was the onset of SOB.  EMS was called and the patient was found to be hypotensive with systolic in the 89-37'D.  Further work up in the ER showed that his HGB declined from 9.7 g/dL (11/25/2021 at [facility]) to 7.7 g/dL on admission.  The CTA was negative for any overt bleeding.  A rectal examination was performed in the ER and he had brown heme positive stool.  The patient receives his oncologic care from [facility].  His final radiation treatment was this past Wednesday.  The radiation is difficult for him to tolerate and it does induce diarrhea, but he felt that his post treatment diarrhea was worse this time.  Intermittently he will have minor hematochezia that he attributes to his hemorrhoids.  Since starting on Eliquis he does not report having any worsening of his hematochezia.  The patient denies having a prior colonoscopy.  Past Medical History:  Diagnosis Date   Deviated septum    Hyperlipidemia    Melanoma ([place])    Obese    Observation for suspected cardiovascular disease    Other dyspnea and respiratory abnormality    Sleep disturbance, unspecified     Past Surgical History:  Procedure Laterality Date   ADENOIDECTOMY  02/28/2012   Procedure: ADENOIDECTOMY;  Surgeon: Ascencion Dike, MD;  Location: [place];  Service: ENT;  Laterality: N/A;   FRACTURE SURGERY     KNEE SURGERY     NASAL SEPTOPLASTY W/ TURBINOPLASTY  02/28/2012   Procedure: NASAL SEPTOPLASTY WITH TURBINATE REDUCTION;  Surgeon: Ascencion Dike, MD;  Location: [facility] OR;  Service: ENT;   Laterality: Bilateral;  SEPTOPLASTY AND BILATERAL TURBINATE RESECTION     TONSILLECTOMY     TURBINATE RESECTION  03/01/2011    Family History  Problem Relation Age of Onset   Heart disease Father    Heart disease Paternal Grandfather     Social History:  reports that he quit smoking about 25 years ago. He has never used smokeless tobacco. He reports current alcohol use. He reports that he does not use drugs.  Allergies:  Allergies  Allergen Reactions   Tramadol Nausea And Vomiting   Vicodin [Hydrocodone-Acetaminophen] Nausea Only    Hot Flashes    Medications: Scheduled:  HYDROmorphone  6 mg Oral Q4H   methadone  10 mg Oral Q8H   OLANZapine  5 mg Oral QHS   pregabalin  75 mg Oral Q12H   Continuous:  sodium chloride 1,000 mL (12/05/21 1337)   sodium chloride Stopped (12/05/21 0955)    Results for orders placed or performed during the hospital encounter of 12/05/21 (from the past 24 hour(s))  Comprehensive metabolic panel     Status: Abnormal   Collection Time: 12/05/21  5:34 AM  Result Value Ref Range   Sodium 138 135 - 145 mmol/L   Potassium 4.3 3.5 - 5.1 mmol/L   Chloride 110 98 - 111 mmol/L   CO2 12 (L) 22 - 32 mmol/L   Glucose, Bld 242 (H) 70 - 99 mg/dL  BUN 12 6 - 20 mg/dL   Creatinine, Ser 1.43 (H) 0.61 - 1.24 mg/dL   Calcium 9.7 8.9 - 10.3 mg/dL   Total Protein 5.8 (L) 6.5 - 8.1 g/dL   Albumin 2.3 (L) 3.5 - 5.0 g/dL   AST 35 15 - 41 U/L   ALT 22 0 - 44 U/L   Alkaline Phosphatase 114 38 - 126 U/L   Total Bilirubin 0.5 0.3 - 1.2 mg/dL   GFR, Estimated >60 >60 mL/min   Anion gap 16 (H) 5 - 15  Resp Panel by RT-PCR (Flu A&B, Covid) Anterior Nasal Swab     Status: None   Collection Time: 12/05/21  5:34 AM   Specimen: Anterior Nasal Swab  Result Value Ref Range   SARS Coronavirus 2 by RT PCR NEGATIVE NEGATIVE   Influenza A by PCR NEGATIVE NEGATIVE   Influenza B by PCR NEGATIVE NEGATIVE  CBC with Differential/Platelet     Status: Abnormal   Collection Time:  12/05/21  5:34 AM  Result Value Ref Range   WBC 14.1 (H) 4.0 - 10.5 K/uL   RBC 2.88 (L) 4.22 - 5.81 MIL/uL   Hemoglobin 7.7 (L) 13.0 - 17.0 g/dL   HCT 26.5 (L) 39.0 - 52.0 %   MCV 92.0 80.0 - 100.0 fL   MCH 26.7 26.0 - 34.0 pg   MCHC 29.1 (L) 30.0 - 36.0 g/dL   RDW 17.2 (H) 11.5 - 15.5 %   Platelets 596 (H) 150 - 400 K/uL   nRBC 0.2 0.0 - 0.2 %   Neutrophils Relative % 85 %   Neutro Abs 11.9 (H) 1.7 - 7.7 K/uL   Lymphocytes Relative 7 %   Lymphs Abs 1.0 0.7 - 4.0 K/uL   Monocytes Relative 6 %   Monocytes Absolute 0.9 0.1 - 1.0 K/uL   Eosinophils Relative 0 %   Eosinophils Absolute 0.0 0.0 - 0.5 K/uL   Basophils Relative 0 %   Basophils Absolute 0.1 0.0 - 0.1 K/uL   Immature Granulocytes 2 %   Abs Immature Granulocytes 0.33 (H) 0.00 - 0.07 K/uL  Brain natriuretic peptide     Status: None   Collection Time: 12/05/21  5:34 AM  Result Value Ref Range   B Natriuretic Peptide 80.2 0.0 - 100.0 pg/mL  Troponin I (High Sensitivity)     Status: Abnormal   Collection Time: 12/05/21  5:34 AM  Result Value Ref Range   Troponin I (High Sensitivity) 63 (H) <18 ng/L  Type and screen [facility] Foley     Status: None (Preliminary result)   Collection Time: 12/05/21  5:34 AM  Result Value Ref Range   ABO/RH(D) A POS    Antibody Screen NEG    Sample Expiration 12/08/2021,2359    Unit Number [id number]    Blood Component Type RED CELLS,LR    Unit division 00    Status of Unit ISSUED    Transfusion Status OK TO TRANSFUSE    Crossmatch Result      Compatible Performed at [facility], [address] [place], [place] [address]   Cortisol     Status: None   Collection Time: 12/05/21  5:34 AM  Result Value Ref Range   Cortisol, Plasma 28.1 ug/dL  Prepare RBC (crossmatch)     Status: None   Collection Time: 12/05/21  6:16 AM  Result Value Ref Range   Order Confirmation      ORDER PROCESSED BY BLOOD BANK Performed at [facility]  [facility], [place] [address]  [place], [place] [address]   ABO/Rh     Status: None   Collection Time: 12/05/21  6:20 AM  Result Value Ref Range   ABO/RH(D)      A POS Performed at [facility] [address] [place], [place] [address]   Troponin I (High Sensitivity)     Status: Abnormal   Collection Time: 12/05/21  9:26 AM  Result Value Ref Range   Troponin I (High Sensitivity) 110 (HH) <18 ng/L  Urinalysis, Routine w reflex microscopic Urine, Clean Catch     Status: Abnormal   Collection Time: 12/05/21 12:11 PM  Result Value Ref Range   Color, Urine YELLOW YELLOW   APPearance HAZY (A) CLEAR   Specific Gravity, Urine 1.029 1.005 - 1.030   pH 5.0 5.0 - 8.0   Glucose, UA NEGATIVE NEGATIVE mg/dL   Hgb urine dipstick NEGATIVE NEGATIVE   Bilirubin Urine NEGATIVE NEGATIVE   Ketones, ur NEGATIVE NEGATIVE mg/dL   Protein, ur 30 (A) NEGATIVE mg/dL   Nitrite NEGATIVE NEGATIVE   Leukocytes,Ua NEGATIVE NEGATIVE   RBC / HPF 0-5 0 - 5 RBC/hpf   WBC, UA 0-5 0 - 5 WBC/hpf   Bacteria, UA RARE (A) NONE SEEN   Squamous Epithelial / LPF 0-5 0 - 5   Mucus PRESENT    Hyaline Casts, UA PRESENT   Hemoglobin and hematocrit, blood     Status: Abnormal   Collection Time: 12/05/21  3:11 PM  Result Value Ref Range   Hemoglobin 7.1 (L) 13.0 - 17.0 g/dL   HCT 22.7 (L) 39.0 - 52.0 %     US RENAL  Result Date: 12/05/2021 CLINICAL DATA:  AKI, history of metastatic melanoma EXAM: RENAL / URINARY TRACT ULTRASOUND COMPLETE COMPARISON:  CT angio chest December 05, 2021 FINDINGS: Right Kidney: Renal measurements: 12.4 x 6.1 x 5.3 cm = volume: 210.8 mL. Echogenicity within normal limits. There is a large exophytic complex solid and cystic mass originating from the superior pole of the right kidney that measures at least 12.6 x 12.1 x 12.1 cm. Left Kidney: Renal measurements: 11.5 x 6.5 x 5.5 cm = volume: 212.4 mL. Echogenicity within normal limits. No mass or hydronephrosis visualized. There is a 6 mm echogenic structure within the inferior  pole of the left kidney with twinkling artifact, suggestive of a renal stone. No definite shadowing is visualized. Bladder: Appears normal for degree of bladder distention. Other: None. IMPRESSION: 1. Large 12.6 cm exophytic complex solid and cystic mass originating from the superior pole of the right kidney, corresponding with the partially visualized mass seen on same-day CT. In the setting of known metastatic disease, recommend further assessment with CT abdomen and pelvis with contrast. 2. No hydronephrosis bilaterally. 3. Probable 6 mm nonobstructive stone in the left kidney. Electronically Signed   By: Beryle Flock M.D.   On: 12/05/2021 13:15   CT Angio Chest PE W and/or Wo Contrast  Result Date: 12/05/2021 CLINICAL DATA:  51 year old male with clinical findings concerning for pulmonary embolism. History of melanoma. * Tracking Code: BO * EXAM: CT ANGIOGRAPHY CHEST WITH CONTRAST TECHNIQUE: Multidetector CT imaging of the chest was performed using the standard protocol during bolus administration of intravenous contrast. Multiplanar CT image reconstructions and MIPs were obtained to evaluate the vascular anatomy. RADIATION DOSE REDUCTION: This exam was performed according to the departmental dose-optimization program which includes automated exposure control, adjustment of the mA and/or kV according to patient size  and/or use of iterative reconstruction technique. CONTRAST:  51m OMNIPAQUE IOHEXOL 350 MG/ML SOLN COMPARISON:  No priors. FINDINGS: Cardiovascular: No filling defects within the pulmonary arterial tree to suggest pulmonary embolism. Heart size is normal. There is no significant pericardial fluid, thickening or pericardial calcification. There is aortic atherosclerosis, as well as atherosclerosis of the great vessels of the mediastinum and the coronary arteries, including calcified atherosclerotic plaque in the left anterior descending coronary artery. Mediastinum/Nodes: Right hilar nodal  mass measuring 3.3 x 3.8 cm (axial image 55 of series 9). Multiple other prominent borderline enlarged and mildly enlarged mediastinal lymph nodes are also noted, largest of which measures up to 1.6 cm in short axis in the middle mediastinum adjacent to the distal esophagus (axial image 95 of series 9). Esophagus is unremarkable in appearance. No axillary lymphadenopathy. Lungs/Pleura: 2.7 x 1.8 cm right lower lobe pulmonary nodule (axial image 69 of series 10). 3.3 x 2.4 cm left lower lobe mass (axial image 101 of series 10). Patchy areas of ground-glass attenuation and irregular septal thickening are noted throughout the right lung, concerning for potential developing lymphangitic spread of tumor. Small right-sided pleural effusion and pleural nodularity noted in the base of the right hemithorax, concerning for a malignant pleural effusion. An additional pleural-based nodule in the base of the right lower lobe is noted (axial image 89 of series 9) measuring 1.8 x 1.4 cm. Trace amount of left pleural thickening also noted in the base of the left hemithorax. Upper Abdomen: Multiple poorly defined hepatic lesions, largest of which include a lesion in the posterior aspect of segment 7 (axial image 103 of series 9) measuring 6.4 x 5.4 cm, and a lesion in segment 3 of the liver (axial image 123 of series 9) measuring 4.9 x 4.8 cm. There is also a very large low-attenuation lesion in the upper right retroperitoneum incompletely imaged, but favored to be exophytic associated with the right kidney measuring at least 14.4 x 11.7 cm (axial image 141 of series 9). Small amount of ascites adjacent to the liver and spleen. Musculoskeletal: Sclerotic lesion measuring up to 2.8 cm in the anterior aspect of the T11 vertebral body with mild pathologic compression of the superior and inferior endplates with 252%loss of anterior vertebral body height. Post vertebroplasty changes appear in this region, but are limited. There are post  vertebroplasty changes at T12. 8 mm sclerotic lesion in the anterior/superior aspect of the T3 vertebral body (sagittal image 121 of series 13). Review of the MIP images confirms the above findings. IMPRESSION: 1. No evidence of pulmonary embolism. 2. Widespread metastatic disease to the lungs, pleura, right hilar and mediastinal lymph nodes and liver, as above. In addition, there is a small volume of presumably malignant ascites, and a large incompletely imaged mass in the upper right retroperitoneum which could be exophytic from the right lobe of the liver but is favored to be exophytic from the right kidney. Further evaluation with contrast-enhanced CT of the abdomen and pelvis is recommended at this time to evaluate for additional sites of metastatic disease in the abdomen. 3. Sclerotic lesion with mild pathologic compression at T11, likely metastatic. Additional small sclerotic lesion in the anterior aspect of T3 vertebral body suspicious for metastasis. 4. Aortic atherosclerosis, in addition to left anterior descending coronary artery disease. Please note that although the presence of coronary artery calcium documents the presence of coronary artery disease, the severity of this disease and any potential stenosis cannot be assessed on this non-gated CT  examination. Assessment for potential risk factor modification, dietary therapy or pharmacologic therapy may be warranted, if clinically indicated. Aortic Atherosclerosis (ICD10-I70.0). Electronically Signed   By: Vinnie Langton M.D.   On: 12/05/2021 09:22   DG Chest Port 1 View  Result Date: 12/05/2021 CLINICAL DATA:  Shortness of breath and generalized weakness EXAM: PORTABLE CHEST 1 VIEW COMPARISON:  01/07/2019 FINDINGS: Normal heart size and mediastinal contours. No air bronchogram or edema. Vague rounded density behind the heart measuring approximately 3 cm. No effusion or pneumothorax. No acute osseous findings. IMPRESSION: Vague nodular density  behind the heart, recommend two-view chest radiograph versus CT. No edema or pneumonia. Electronically Signed   By: Jorje Guild M.D.   On: 12/05/2021 06:16    ROS:  As stated above in the HPI otherwise negative.  Blood pressure 111/77, pulse (!) 101, temperature 97.9 F (36.6 C), temperature source Oral, resp. rate 16, height '6\' 2"'$  (1.88 m), weight 120.2 kg, SpO2 97 %.    PE: Gen: NAD, Alert and Oriented HEENT:  [place]/AT, EOMI Neck: Supple, no LAD Lungs: CTA Bilaterally CV: RRR without M/G/R ABD: Soft, NTND, +BS Ext: No C/C/E Rectal: external visualization was normal  Assessment/Plan: 1) Anemia. 2) DVT on Eliquis. 3) Metastatic melanoma. 4) Diarrhea - radiation-induced.   The patient's anemia occurred the beginning of the year.  Progressively it declined down to a new baseline of around 9 g/dL.  Clinically he is feeling better, but he is still weak.  Again, radiation treatment is hard on his body.  We had a long discussion about further work up in light of his metastatic disease.  He is to start on a new oral regimen that may possibly allow him to be enrolled in an experimental protocol.  Currently he knows that he is too fatigued to try and prep for a colonoscopy and he does not want that procedure.  The pros and cons of performing a FFS were discussed and his family would like to know if there is any pathology.    Plan: 1) FFS tomorrow with Dr. Havery Moros.  Alexsander Cavins D 12/05/2021, 3:59 PM

## 2021-12-05 NOTE — ED Provider Notes (Signed)
[facility] DEPART[facility] Provider Note  CSN: [id number] Arrival date & time: 12/05/21 2423  Chief Complaint(s) Shortness of Breath  HPI Richard Sloop. is a 51 y.o. 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  He reports rolling out of bed this morning and having generalized weakness while trying to get up.  He felt immediate shortness of breath prompting call to 911.  Prior to EMS arrival, fire had already arrived and placed patient on nonrebreather.  EMS reported patient was hypotensive with systolics in the 53I to 14E.  He was given 1 L of IV fluid.  Patient denies any recent fevers or infections.  No coughing or congestion.  No chest pain.  No abdominal pain.  No bloody or melenic bowel movements.  He is compliant with Eliquis.  The history is provided by the patient.    Past Medical History Past Medical History:  Diagnosis Date   Deviated septum    Hyperlipidemia    Melanoma ([place])    Obese    Observation for suspected cardiovascular disease    Other dyspnea and respiratory abnormality    Sleep disturbance, unspecified    There are no problems to display for this patient.  Home Medication(s) Prior to Admission medications   Medication Sig Start Date End Date Taking? Authorizing Provider  benzonatate (TESSALON) 200 MG capsule Take 1 capsule (200 mg total) by mouth 3 (three) times daily as needed for cough. 01/07/19   Coral Spikes, DO  colchicine 0.6 MG tablet Take 0.'6mg'$  (one tablet) by mouth every 1-2 hours until one of the following occurs: 1.  The pain is gone 2.  The maximum dose has been given ( no more than 3 tabs in 3 hours or 10 tabs in 24 hours) 3.  The side effects outweight the benefits 08/17/16   Pisciotta, Elmyra Ricks,  PA-C  Nivolumab (OPDIVO IV) Inject into the vein.    [provider]                                                                                                                                    Allergies Tramadol and Vicodin [hydrocodone-acetaminophen]  Review of Systems Review of Systems As noted in HPI  Physical Exam Vital Signs  I have reviewed the triage vital signs BP 108/77   Pulse (!) 108   Temp (!) 97.5 F (36.4 C) (Rectal)   Resp (!) 24   SpO2 100%   Physical Exam Vitals reviewed.  Constitutional:      General: He is in acute distress.     Appearance: He is well-developed. He is ill-appearing and toxic-appearing.     Interventions: Face mask in place.  HENT:  Head: Normocephalic and atraumatic.     Nose: Nose normal.  Eyes:     General: No scleral icterus.       Right eye: No discharge.        Left eye: No discharge.     Conjunctiva/sclera: Conjunctivae normal.     Pupils: Pupils are equal, round, and reactive to light.     Comments: Pale conjunctiva  Cardiovascular:     Rate and Rhythm: Regular rhythm. Tachycardia present.     Heart sounds: No murmur heard.    No friction rub. No gallop.  Pulmonary:     Effort: Pulmonary effort is normal. No respiratory distress.     Breath sounds: Normal breath sounds. No stridor. No rales.  Abdominal:     General: There is no distension.     Palpations: Abdomen is soft.     Tenderness: There is no abdominal tenderness.  Genitourinary:    Rectum: Guaiac result positive.     Comments: Light brown stool Musculoskeletal:        General: No tenderness.     Cervical back: Normal range of motion and neck supple. No tenderness or bony tenderness.     Thoracic back: No tenderness or bony tenderness.     Lumbar back: No tenderness or bony tenderness.  Skin:    General: Skin is warm and dry.     Findings: No erythema or rash.  Neurological:     Mental Status: He is alert and oriented to person, place, and  time.     ED Results and Treatments Labs (all labs ordered are listed, but only abnormal results are displayed) Labs Reviewed  CBC WITH DIFFERENTIAL/PLATELET - Abnormal; Notable for the following components:      Result Value   WBC 14.1 (*)    RBC 2.88 (*)    Hemoglobin 7.7 (*)    HCT 26.5 (*)    MCHC 29.1 (*)    RDW 17.2 (*)    Platelets 596 (*)    Neutro Abs 11.9 (*)    Abs Immature Granulocytes 0.33 (*)    All other components within normal limits  RESP PANEL BY RT-PCR (FLU A&B, COVID) ARPGX2  COMPREHENSIVE METABOLIC PANEL  BRAIN NATRIURETIC PEPTIDE  CORTISOL  POC OCCULT BLOOD, ED  TYPE AND SCREEN  ABO/RH  PREPARE RBC (CROSSMATCH)  TROPONIN I (HIGH SENSITIVITY)                                                                                                                         EKG  EKG Interpretation  Date/Time:  Sunday December 05 2021 05:27:03 EDT Ventricular Rate:  122 PR Interval:  136 QRS Duration: 91 QT Interval:  332 QTC Calculation: 473 R Axis:   48 Text Interpretation: Sinus tachycardia Confirmed by Addison Lank [id number]) on 12/05/2021 5:41:17 AM       Radiology DG Chest Port 1 View  Result Date: 12/05/2021 CLINICAL DATA:  Shortness of breath and generalized weakness  EXAM: PORTABLE CHEST 1 VIEW COMPARISON:  01/07/2019 FINDINGS: Normal heart size and mediastinal contours. No air bronchogram or edema. Vague rounded density behind the heart measuring approximately 3 cm. No effusion or pneumothorax. No acute osseous findings. IMPRESSION: Vague nodular density behind the heart, recommend two-view chest radiograph versus CT. No edema or pneumonia. Electronically Signed   By: Jorje Guild M.D.   On: 12/05/2021 06:16    Medications Ordered in ED Medications  sodium chloride 0.9 % bolus 1,000 mL (1,000 mLs Intravenous New Bag/Given 12/05/21 0612)    Followed by  sodium chloride 0.9 % bolus 1,000 mL (1,000 mLs Intravenous New Bag/Given 12/05/21 0611)     Followed by  0.9 %  sodium chloride infusion (has no administration in time range)  0.9 %  sodium chloride infusion (has no administration in time range)                                                                                                                                     Procedures .1-3 Lead EKG Interpretation  Performed by: Fatima Blank, MD Authorized by: Fatima Blank, MD     Interpretation: abnormal     ECG rate:  122   ECG rate assessment: tachycardic     Rhythm: sinus tachycardia     Conduction: normal   .Critical Care  Performed by: Fatima Blank, MD Authorized by: Fatima Blank, MD   Critical care provider statement:    Critical care time (minutes):  60   Critical care time was exclusive of:  Separately billable procedures and treating other patients   Critical care was necessary to treat or prevent imminent or life-threatening deterioration of the following conditions:  Circulatory failure and respiratory failure   Critical care was time spent personally by me on the following activities:  Development of treatment plan with patient or surrogate, discussions with consultants, evaluation of patient's response to treatment, examination of patient, obtaining history from patient or surrogate, review of old charts, re-evaluation of patient's condition, pulse oximetry, ordering and review of radiographic studies, ordering and review of laboratory studies and ordering and performing treatments and interventions   (including critical care time)  Medical Decision Making / ED Course   Medical Decision Making Amount and/or Complexity of Data Reviewed External Data Reviewed: radiology.    Details: OSH CT 10/03: 1. Interval increase in size and number of pulmonary masses and nodules  compatible with disease progression.  2. Interval increase in size of right hilar adenopathy.  3. Interval increase in size and number of hepatic masses  compatible with  metastatic disease.   Labs: ordered. Decision-making details documented in ED Course. Radiology: ordered and independent interpretation performed. Decision-making details documented in ED Course. ECG/medicine tests: ordered and independent interpretation performed. Decision-making details documented in ED Course.  Risk Prescription drug management. Decision regarding hospitalization.   Sudden onset back pain and shortness of  breath requiring nonrebreather, patient is tachycardic and hypotensive.  We will need to assess for PE, severe anemia, infection.  EKG was sinus tachycardia.  No dysrhythmias. CBC with leukocytosis.  Anemia with a hemoglobin of 7.7 which is a 2 g drop from 1 month ago (noted on outside hospital CBC) Hemoccult positive. CMP pending COVID/influenza negative BNP pending  Troponin pending Cortisol to assess for adrenal insufficiency pending Chest x-ray without evidence of pneumonia, pneumothorax, pulmonary edema.  Started to 2L IV bolus. BP improved from SBP 70 to SBP 100 1U pRBCs for anemia. Will need GI consult during admission. CTA pending    Patient care turned over to oncoming provider. Patient case and results discussed in detail; please see their note for further ED managment.      Final Clinical Impression(s) / ED Diagnoses Final diagnoses:  None           This chart was dictated using voice recognition software.  Despite best efforts to proofread,  errors can occur which can change the documentation meaning.    Fatima Blank, MD 12/05/21 [phone]

## 2021-12-05 NOTE — ED Notes (Signed)
Pt moved on to hospital bed. Pt able to moved himself across bed. Moving made pt short of breath.

## 2021-12-05 NOTE — ED Triage Notes (Signed)
BIB EMS for increased SOB and increased generalized weakness.

## 2021-12-05 NOTE — H&P (View-Only) (Signed)
CONSULT NOTE FOR [facility]  Reason for Consult: Anemia, weakness, heme positive stool Referring Physician: [place]. HPI: This is a 51 year old male with widely metastatic melanoma (the original diagnosis of melanoma  was in the left groin on 09/2018 at [facility]), DVT on Eliquis, hyperlipidemia, and obesity admitted for generalized weakness and SOB.  He woke up around 3 AM to use the restroom, but he felt very weak and there was the onset of SOB.  EMS was called and the patient was found to be hypotensive with systolic in the 15-40'G.  Further work up in the ER showed that his HGB declined from 9.7 g/dL (11/25/2021 at [facility]) to 7.7 g/dL on admission.  The CTA was negative for any overt bleeding.  A rectal examination was performed in the ER and he had brown heme positive stool.  The patient receives his oncologic care from [facility]  His final radiation treatment was this past Wednesday.  The radiation is difficult for him to tolerate and it does induce diarrhea, but he felt that his post treatment diarrhea was worse this time.  Intermittently he will have minor hematochezia that he attributes to his hemorrhoids.  Since starting on Eliquis he does not report having any worsening of his hematochezia.  The patient denies having a prior colonoscopy.  Past Medical History:  Diagnosis Date   Deviated septum    Hyperlipidemia    Melanoma ([place])    Obese    Observation for suspected cardiovascular disease    Other dyspnea and respiratory abnormality    Sleep disturbance, unspecified     Past Surgical History:  Procedure Laterality Date   ADENOIDECTOMY  02/28/2012   Procedure: ADENOIDECTOMY;  Surgeon: Ascencion Dike, MD;  Location: [place];  Service: ENT;  Laterality: N/A;   FRACTURE SURGERY     KNEE SURGERY     NASAL SEPTOPLASTY W/ TURBINOPLASTY  02/28/2012   Procedure: NASAL SEPTOPLASTY WITH TURBINATE REDUCTION;  Surgeon: Ascencion Dike, MD;  Location: [facility] OR;  Service: ENT;   Laterality: Bilateral;  SEPTOPLASTY AND BILATERAL TURBINATE RESECTION     TONSILLECTOMY     TURBINATE RESECTION  03/01/2011    Family History  Problem Relation Age of Onset   Heart disease Father    Heart disease Paternal Grandfather     Social History:  reports that he quit smoking about 25 years ago. He has never used smokeless tobacco. He reports current alcohol use. He reports that he does not use drugs.  Allergies:  Allergies  Allergen Reactions   Tramadol Nausea And Vomiting   Vicodin [Hydrocodone-Acetaminophen] Nausea Only    Hot Flashes    Medications: Scheduled:  HYDROmorphone  6 mg Oral Q4H   methadone  10 mg Oral Q8H   OLANZapine  5 mg Oral QHS   pregabalin  75 mg Oral Q12H   Continuous:  sodium chloride 1,000 mL (12/05/21 1337)   sodium chloride Stopped (12/05/21 0955)    Results for orders placed or performed during the hospital encounter of 12/05/21 (from the past 24 hour(s))  Comprehensive metabolic panel     Status: Abnormal   Collection Time: 12/05/21  5:34 AM  Result Value Ref Range   Sodium 138 135 - 145 mmol/L   Potassium 4.3 3.5 - 5.1 mmol/L   Chloride 110 98 - 111 mmol/L   CO2 12 (L) 22 - 32 mmol/L   Glucose, Bld 242 (H) 70 - 99 mg/dL  BUN 12 6 - 20 mg/dL   Creatinine, Ser 1.43 (H) 0.61 - 1.24 mg/dL   Calcium 9.7 8.9 - 10.3 mg/dL   Total Protein 5.8 (L) 6.5 - 8.1 g/dL   Albumin 2.3 (L) 3.5 - 5.0 g/dL   AST 35 15 - 41 U/L   ALT 22 0 - 44 U/L   Alkaline Phosphatase 114 38 - 126 U/L   Total Bilirubin 0.5 0.3 - 1.2 mg/dL   GFR, Estimated >60 >60 mL/min   Anion gap 16 (H) 5 - 15  Resp Panel by RT-PCR (Flu A&B, Covid) Anterior Nasal Swab     Status: None   Collection Time: 12/05/21  5:34 AM   Specimen: Anterior Nasal Swab  Result Value Ref Range   SARS Coronavirus 2 by RT PCR NEGATIVE NEGATIVE   Influenza A by PCR NEGATIVE NEGATIVE   Influenza B by PCR NEGATIVE NEGATIVE  CBC with Differential/Platelet     Status: Abnormal   Collection Time:  12/05/21  5:34 AM  Result Value Ref Range   WBC 14.1 (H) 4.0 - 10.5 K/uL   RBC 2.88 (L) 4.22 - 5.81 MIL/uL   Hemoglobin 7.7 (L) 13.0 - 17.0 g/dL   HCT 26.5 (L) 39.0 - 52.0 %   MCV 92.0 80.0 - 100.0 fL   MCH 26.7 26.0 - 34.0 pg   MCHC 29.1 (L) 30.0 - 36.0 g/dL   RDW 17.2 (H) 11.5 - 15.5 %   Platelets 596 (H) 150 - 400 K/uL   nRBC 0.2 0.0 - 0.2 %   Neutrophils Relative % 85 %   Neutro Abs 11.9 (H) 1.7 - 7.7 K/uL   Lymphocytes Relative 7 %   Lymphs Abs 1.0 0.7 - 4.0 K/uL   Monocytes Relative 6 %   Monocytes Absolute 0.9 0.1 - 1.0 K/uL   Eosinophils Relative 0 %   Eosinophils Absolute 0.0 0.0 - 0.5 K/uL   Basophils Relative 0 %   Basophils Absolute 0.1 0.0 - 0.1 K/uL   Immature Granulocytes 2 %   Abs Immature Granulocytes 0.33 (H) 0.00 - 0.07 K/uL  Brain natriuretic peptide     Status: None   Collection Time: 12/05/21  5:34 AM  Result Value Ref Range   B Natriuretic Peptide 80.2 0.0 - 100.0 pg/mL  Troponin I (High Sensitivity)     Status: Abnormal   Collection Time: 12/05/21  5:34 AM  Result Value Ref Range   Troponin I (High Sensitivity) 63 (H) <18 ng/L  Type and screen [facility] [place]     Status: None (Preliminary result)   Collection Time: 12/05/21  5:34 AM  Result Value Ref Range   ABO/RH(D) A POS    Antibody Screen NEG    Sample Expiration 12/08/2021,2359    Unit Number [id number]    Blood Component Type RED CELLS,LR    Unit division 00    Status of Unit ISSUED    Transfusion Status OK TO TRANSFUSE    Crossmatch Result      Compatible Performed at [facility], [address], [place], [place] [address]   Cortisol     Status: None   Collection Time: 12/05/21  5:34 AM  Result Value Ref Range   Cortisol, Plasma 28.1 ug/dL  Prepare RBC (crossmatch)     Status: None   Collection Time: 12/05/21  6:16 AM  Result Value Ref Range   Order Confirmation      ORDER PROCESSED BY BLOOD BANK Performed at [facility]  [facility], [place] [address]  [place], [place] [address]   ABO/Rh     Status: None   Collection Time: 12/05/21  6:20 AM  Result Value Ref Range   ABO/RH(D)      A POS Performed at [facility] [address], [place], [place] [address]   Troponin I (High Sensitivity)     Status: Abnormal   Collection Time: 12/05/21  9:26 AM  Result Value Ref Range   Troponin I (High Sensitivity) 110 (HH) <18 ng/L  Urinalysis, Routine w reflex microscopic Urine, Clean Catch     Status: Abnormal   Collection Time: 12/05/21 12:11 PM  Result Value Ref Range   Color, Urine YELLOW YELLOW   APPearance HAZY (A) CLEAR   Specific Gravity, Urine 1.029 1.005 - 1.030   pH 5.0 5.0 - 8.0   Glucose, UA NEGATIVE NEGATIVE mg/dL   Hgb urine dipstick NEGATIVE NEGATIVE   Bilirubin Urine NEGATIVE NEGATIVE   Ketones, ur NEGATIVE NEGATIVE mg/dL   Protein, ur 30 (A) NEGATIVE mg/dL   Nitrite NEGATIVE NEGATIVE   Leukocytes,Ua NEGATIVE NEGATIVE   RBC / HPF 0-5 0 - 5 RBC/hpf   WBC, UA 0-5 0 - 5 WBC/hpf   Bacteria, UA RARE (A) NONE SEEN   Squamous Epithelial / LPF 0-5 0 - 5   Mucus PRESENT    Hyaline Casts, UA PRESENT   Hemoglobin and hematocrit, blood     Status: Abnormal   Collection Time: 12/05/21  3:11 PM  Result Value Ref Range   Hemoglobin 7.1 (L) 13.0 - 17.0 g/dL   HCT 22.7 (L) 39.0 - 52.0 %     US RENAL  Result Date: 12/05/2021 CLINICAL DATA:  AKI, history of metastatic melanoma EXAM: RENAL / URINARY TRACT ULTRASOUND COMPLETE COMPARISON:  CT angio chest December 05, 2021 FINDINGS: Right Kidney: Renal measurements: 12.4 x 6.1 x 5.3 cm = volume: 210.8 mL. Echogenicity within normal limits. There is a large exophytic complex solid and cystic mass originating from the superior pole of the right kidney that measures at least 12.6 x 12.1 x 12.1 cm. Left Kidney: Renal measurements: 11.5 x 6.5 x 5.5 cm = volume: 212.4 mL. Echogenicity within normal limits. No mass or hydronephrosis visualized. There is a 6 mm echogenic structure within the inferior  pole of the left kidney with twinkling artifact, suggestive of a renal stone. No definite shadowing is visualized. Bladder: Appears normal for degree of bladder distention. Other: None. IMPRESSION: 1. Large 12.6 cm exophytic complex solid and cystic mass originating from the superior pole of the right kidney, corresponding with the partially visualized mass seen on same-day CT. In the setting of known metastatic disease, recommend further assessment with CT abdomen and pelvis with contrast. 2. No hydronephrosis bilaterally. 3. Probable 6 mm nonobstructive stone in the left kidney. Electronically Signed   By: Beryle Flock M.D.   On: 12/05/2021 13:15   CT Angio Chest PE W and/or Wo Contrast  Result Date: 12/05/2021 CLINICAL DATA:  51 year old male with clinical findings concerning for pulmonary embolism. History of melanoma. * Tracking Code: BO * EXAM: CT ANGIOGRAPHY CHEST WITH CONTRAST TECHNIQUE: Multidetector CT imaging of the chest was performed using the standard protocol during bolus administration of intravenous contrast. Multiplanar CT image reconstructions and MIPs were obtained to evaluate the vascular anatomy. RADIATION DOSE REDUCTION: This exam was performed according to the departmental dose-optimization program which includes automated exposure control, adjustment of the mA and/or kV according to patient size  and/or use of iterative reconstruction technique. CONTRAST:  34m OMNIPAQUE IOHEXOL 350 MG/ML SOLN COMPARISON:  No priors. FINDINGS: Cardiovascular: No filling defects within the pulmonary arterial tree to suggest pulmonary embolism. Heart size is normal. There is no significant pericardial fluid, thickening or pericardial calcification. There is aortic atherosclerosis, as well as atherosclerosis of the great vessels of the mediastinum and the coronary arteries, including calcified atherosclerotic plaque in the left anterior descending coronary artery. Mediastinum/Nodes: Right hilar nodal  mass measuring 3.3 x 3.8 cm (axial image 55 of series 9). Multiple other prominent borderline enlarged and mildly enlarged mediastinal lymph nodes are also noted, largest of which measures up to 1.6 cm in short axis in the middle mediastinum adjacent to the distal esophagus (axial image 95 of series 9). Esophagus is unremarkable in appearance. No axillary lymphadenopathy. Lungs/Pleura: 2.7 x 1.8 cm right lower lobe pulmonary nodule (axial image 69 of series 10). 3.3 x 2.4 cm left lower lobe mass (axial image 101 of series 10). Patchy areas of ground-glass attenuation and irregular septal thickening are noted throughout the right lung, concerning for potential developing lymphangitic spread of tumor. Small right-sided pleural effusion and pleural nodularity noted in the base of the right hemithorax, concerning for a malignant pleural effusion. An additional pleural-based nodule in the base of the right lower lobe is noted (axial image 89 of series 9) measuring 1.8 x 1.4 cm. Trace amount of left pleural thickening also noted in the base of the left hemithorax. Upper Abdomen: Multiple poorly defined hepatic lesions, largest of which include a lesion in the posterior aspect of segment 7 (axial image 103 of series 9) measuring 6.4 x 5.4 cm, and a lesion in segment 3 of the liver (axial image 123 of series 9) measuring 4.9 x 4.8 cm. There is also a very large low-attenuation lesion in the upper right retroperitoneum incompletely imaged, but favored to be exophytic associated with the right kidney measuring at least 14.4 x 11.7 cm (axial image 141 of series 9). Small amount of ascites adjacent to the liver and spleen. Musculoskeletal: Sclerotic lesion measuring up to 2.8 cm in the anterior aspect of the T11 vertebral body with mild pathologic compression of the superior and inferior endplates with 262%loss of anterior vertebral body height. Post vertebroplasty changes appear in this region, but are limited. There are post  vertebroplasty changes at T12. 8 mm sclerotic lesion in the anterior/superior aspect of the T3 vertebral body (sagittal image 121 of series 13). Review of the MIP images confirms the above findings. IMPRESSION: 1. No evidence of pulmonary embolism. 2. Widespread metastatic disease to the lungs, pleura, right hilar and mediastinal lymph nodes and liver, as above. In addition, there is a small volume of presumably malignant ascites, and a large incompletely imaged mass in the upper right retroperitoneum which could be exophytic from the right lobe of the liver but is favored to be exophytic from the right kidney. Further evaluation with contrast-enhanced CT of the abdomen and pelvis is recommended at this time to evaluate for additional sites of metastatic disease in the abdomen. 3. Sclerotic lesion with mild pathologic compression at T11, likely metastatic. Additional small sclerotic lesion in the anterior aspect of T3 vertebral body suspicious for metastasis. 4. Aortic atherosclerosis, in addition to left anterior descending coronary artery disease. Please note that although the presence of coronary artery calcium documents the presence of coronary artery disease, the severity of this disease and any potential stenosis cannot be assessed on this non-gated CT  examination. Assessment for potential risk factor modification, dietary therapy or pharmacologic therapy may be warranted, if clinically indicated. Aortic Atherosclerosis (ICD10-I70.0). Electronically Signed   By: Vinnie Langton M.D.   On: 12/05/2021 09:22   DG Chest Port 1 View  Result Date: 12/05/2021 CLINICAL DATA:  Shortness of breath and generalized weakness EXAM: PORTABLE CHEST 1 VIEW COMPARISON:  01/07/2019 FINDINGS: Normal heart size and mediastinal contours. No air bronchogram or edema. Vague rounded density behind the heart measuring approximately 3 cm. No effusion or pneumothorax. No acute osseous findings. IMPRESSION: Vague nodular density  behind the heart, recommend two-view chest radiograph versus CT. No edema or pneumonia. Electronically Signed   By: Jorje Guild M.D.   On: 12/05/2021 06:16    ROS:  As stated above in the HPI otherwise negative.  Blood pressure 111/77, pulse (!) 101, temperature 97.9 F (36.6 C), temperature source Oral, resp. rate 16, height '6\' 2"'$  (1.88 m), weight 120.2 kg, SpO2 97 %.    PE: Gen: NAD, Alert and Oriented HEENT:  [place]/AT, EOMI Neck: Supple, no LAD Lungs: CTA Bilaterally CV: RRR without M/G/R ABD: Soft, NTND, +BS Ext: No C/C/E Rectal: external visualization was normal  Assessment/Plan: 1) Anemia. 2) DVT on Eliquis. 3) Metastatic melanoma. 4) Diarrhea - radiation-induced.   The patient's anemia occurred the beginning of the year.  Progressively it declined down to a new baseline of around 9 g/dL.  Clinically he is feeling better, but he is still weak.  Again, radiation treatment is hard on his body.  We had a long discussion about further work up in light of his metastatic disease.  He is to start on a new oral regimen that may possibly allow him to be enrolled in an experimental protocol.  Currently he knows that he is too fatigued to try and prep for a colonoscopy and he does not want that procedure.  The pros and cons of performing a FFS were discussed and his family would like to know if there is any pathology.    Plan: 1) FFS tomorrow with Dr. Havery Moros.  Samael Blades D 12/05/2021, 3:59 PM

## 2021-12-06 ENCOUNTER — Encounter (HOSPITAL_COMMUNITY): Payer: Self-pay | Admitting: Internal Medicine

## 2021-12-06 ENCOUNTER — Encounter (HOSPITAL_COMMUNITY)
Admission: EM | Disposition: A | Discharge status: Home / Self Care | Payer: Self-pay | Source: Home / Self Care | Attending: Internal Medicine

## 2021-12-06 ENCOUNTER — Inpatient Hospital Stay (HOSPITAL_COMMUNITY): Payer: Medicaid Other | Admitting: Certified Registered Nurse Anesthetist

## 2021-12-06 DIAGNOSIS — K921 Melena: Secondary | ICD-10-CM

## 2021-12-06 DIAGNOSIS — D500 Iron deficiency anemia secondary to blood loss (chronic): Secondary | ICD-10-CM

## 2021-12-06 DIAGNOSIS — Z87891 Personal history of nicotine dependence: Secondary | ICD-10-CM

## 2021-12-06 DIAGNOSIS — D649 Anemia, unspecified: Secondary | ICD-10-CM

## 2021-12-06 DIAGNOSIS — K648 Other hemorrhoids: Secondary | ICD-10-CM

## 2021-12-06 DIAGNOSIS — K625 Hemorrhage of anus and rectum: Secondary | ICD-10-CM | POA: Diagnosis present

## 2021-12-06 DIAGNOSIS — I82409 Acute embolism and thrombosis of unspecified deep veins of unspecified lower extremity: Secondary | ICD-10-CM

## 2021-12-06 HISTORY — PX: FLEXIBLE SIGMOIDOSCOPY: SHX5431

## 2021-12-06 LAB — CBC
HCT: 35.4 % — ABNORMAL LOW (ref 39.0–52.0)
HCT: 26.1 % — ABNORMAL LOW (ref 39.0–52.0)
Hemoglobin: 11.5 g/dL — ABNORMAL LOW (ref 13.0–17.0)
Hemoglobin: 8.7 g/dL — ABNORMAL LOW (ref 13.0–17.0)
MCH: 27.7 pg (ref 26.0–34.0)
MCH: 28.1 pg (ref 26.0–34.0)
MCHC: 32.5 g/dL (ref 30.0–36.0)
MCHC: 33.3 g/dL (ref 30.0–36.0)
MCV: 85.3 fL (ref 80.0–100.0)
MCV: 84.2 fL (ref 80.0–100.0)
Platelets: 202 10*3/uL (ref 150–400)
Platelets: 371 10*3/uL (ref 150–400)
RBC: 4.15 MIL/uL — ABNORMAL LOW (ref 4.22–5.81)
RBC: 3.1 MIL/uL — ABNORMAL LOW (ref 4.22–5.81)
RDW: 16.6 % — ABNORMAL HIGH (ref 11.5–15.5)
RDW: 16.8 % — ABNORMAL HIGH (ref 11.5–15.5)
WBC: 3 10*3/uL — ABNORMAL LOW (ref 4.0–10.5)
WBC: 8.3 10*3/uL (ref 4.0–10.5)
nRBC: 0.7 % — ABNORMAL HIGH (ref 0.0–0.2)
nRBC: 0.5 % — ABNORMAL HIGH (ref 0.0–0.2)

## 2021-12-06 LAB — HEMOGLOBIN AND HEMATOCRIT, BLOOD
HCT: 22.8 % — ABNORMAL LOW (ref 39.0–52.0)
Hemoglobin: 7.4 g/dL — ABNORMAL LOW (ref 13.0–17.0)

## 2021-12-06 LAB — BASIC METABOLIC PANEL
Anion gap: 3 — ABNORMAL LOW (ref 5–15)
BUN: 15 mg/dL (ref 6–20)
CO2: 21 mmol/L — ABNORMAL LOW (ref 22–32)
Calcium: 9.1 mg/dL (ref 8.9–10.3)
Chloride: 112 mmol/L — ABNORMAL HIGH (ref 98–111)
Creatinine, Ser: 0.94 mg/dL (ref 0.61–1.24)
GFR, Estimated: >60 mL/min (ref >60)
Glucose, Bld: 92 mg/dL (ref 70–99)
Potassium: 4 mmol/L (ref 3.5–5.1)
Sodium: 136 mmol/L (ref 135–145)

## 2021-12-06 LAB — POCT I-STAT, CHEM 8
BUN: 14 mg/dL (ref 6–20)
Calcium, Ion: 1.43 mmol/L — ABNORMAL HIGH (ref 1.15–1.40)
Chloride: 107 mmol/L (ref 98–111)
Creatinine, Ser: 0.9 mg/dL (ref 0.61–1.24)
Glucose, Bld: 105 mg/dL — ABNORMAL HIGH (ref 70–99)
HCT: 23 % — ABNORMAL LOW (ref 39.0–52.0)
Hemoglobin: 7.8 g/dL — ABNORMAL LOW (ref 13.0–17.0)
Potassium: 3.8 mmol/L (ref 3.5–5.1)
Sodium: 137 mmol/L (ref 135–145)
TCO2: 20 mmol/L — ABNORMAL LOW (ref 22–32)

## 2021-12-06 LAB — VITAMIN B12: Vitamin B-12: 718 pg/mL (ref 180–914)

## 2021-12-06 LAB — IRON AND TIBC
Iron: 122 ug/dL (ref 45–182)
Saturation Ratios: 94 % — ABNORMAL HIGH (ref 17.9–39.5)
TIBC: 130 ug/dL — ABNORMAL LOW (ref 250–450)
UIBC: 8 ug/dL

## 2021-12-06 LAB — RETICULOCYTES
Immature Retic Fract: 42.5 % — ABNORMAL HIGH (ref 2.3–15.9)
RBC.: 3.79 MIL/uL — ABNORMAL LOW (ref 4.22–5.81)
Retic Count, Absolute: 108.8 10*3/uL (ref 19.0–186.0)
Retic Ct Pct: 2.9 % (ref 0.4–3.1)

## 2021-12-06 LAB — FERRITIN: Ferritin: 503 ng/mL — ABNORMAL HIGH (ref 24–336)

## 2021-12-06 LAB — GLUCOSE, CAPILLARY: Glucose-Capillary: 84 mg/dL (ref 70–99)

## 2021-12-06 LAB — PREPARE RBC (CROSSMATCH)

## 2021-12-06 SURGERY — SIGMOIDOSCOPY, FLEXIBLE
Anesthesia: Monitor Anesthesia Care

## 2021-12-06 MED ORDER — PEG-KCL-NACL-NASULF-NA ASC-C 100 G PO SOLR
1.0000 | Freq: Once | ORAL | Status: AC
  Administered 2021-12-06: 200 g via ORAL
  Filled 2021-12-06: qty 1

## 2021-12-06 MED ORDER — SODIUM CHLORIDE 0.9 % IV SOLN: INTRAVENOUS | Status: DC

## 2021-12-06 MED ORDER — LACTATED RINGERS IV SOLN: INTRAVENOUS | Status: DC | PRN

## 2021-12-06 MED ORDER — ACETAMINOPHEN 500 MG PO TABS
1000.0000 mg | ORAL_TABLET | Freq: Four times a day (QID) | ORAL | Status: DC | PRN
  Administered 2021-12-06 – 2021-12-08 (×5): 1000 mg via ORAL
  Filled 2021-12-06 (×5): qty 2

## 2021-12-06 MED ORDER — SODIUM CHLORIDE 0.9% IV SOLUTION: Freq: Once | INTRAVENOUS | Status: DC

## 2021-12-06 MED ORDER — PROPOFOL 10 MG/ML IV BOLUS
INTRAVENOUS | Status: DC | PRN
  Administered 2021-12-06 (×2): 10 mg via INTRAVENOUS

## 2021-12-06 MED ORDER — PROPOFOL 500 MG/50ML IV EMUL
INTRAVENOUS | Status: DC | PRN
  Administered 2021-12-06: 100 ug/kg/min via INTRAVENOUS

## 2021-12-06 MED ORDER — ACETAMINOPHEN 650 MG RE SUPP: 650.0000 mg | Freq: Four times a day (QID) | RECTAL | Status: DC | PRN

## 2021-12-06 NOTE — Op Note (Signed)
[facility] Patient Name: Richard Byrd Procedure Date : 12/06/2021 MRN: [id number] Attending MD: Willaim Rayas. Adela Lank , MD Date of Birth: 1970/08/23 CSN: [id number] Age: 51 Admit Type: Inpatient Procedure:                Flexible Sigmoidoscopy Indications:              Rectal bleeding, anemia - history of metastatic                            melanoma, radiation treatments to the pelvis - rule                            out radiation proctitis Providers:                Willaim Rayas. Adela Lank, MD, Myer Haff, RN, Irene Shipper, Technician Referring MD:              Medicines:                Monitored Anesthesia Care Complications:            No immediate complications. Estimated blood loss:                            None. Estimated Blood Loss:     Estimated blood loss: none. Procedure:                Pre-Anesthesia Assessment:                           - Prior to the procedure, a History and Physical                            was performed, and patient medications and                            allergies were reviewed. The patient's tolerance of                            previous anesthesia was also reviewed. The risks                            and benefits of the procedure and the sedation                            options and risks were discussed with the patient.                            All questions were answered, and informed consent                            was obtained. Prior Anticoagulants: The patient has                            taken  Eliquis (apixaban), last dose was 1 day prior                            to procedure. ASA Grade Assessment: IV - A patient                            with severe systemic disease that is a constant                            threat to life. After reviewing the risks and                            benefits, the patient was deemed in satisfactory                            condition to undergo  the procedure.                           After obtaining informed consent, the scope was                            passed under direct vision. The [id number] ([id number])                            Olympus colonoscope was introduced through the anus                            and advanced to the the descending colon. The                            flexible sigmoidoscopy was accomplished without                            difficulty. The patient tolerated the procedure                            well. The quality of the bowel preparation was                            adequate. Scope In: 11:25:58 AM Scope Out: 11:31:04 AM Total Procedure Duration: 0 hours 5 minutes 6 seconds  Findings:      The perianal and digital rectal examinations were normal.      Internal hemorrhoids were found. The hemorrhoids were small.      The rectum, recto-sigmoid colon, sigmoid colon and distal descending       colon appeared normal. No radiation proctitis. The rectum was small,       retroflexed views not obtained. NO cause for anemia on this exam. Impression:               - Internal hemorrhoids.                           - The rectum, recto-sigmoid colon, sigmoid colon  and distal descending colon are normal.                           - No cause for anemia on this exam. No radiation                            proctitis.                           Will discuss options with the patient. I think EGD                            and full colonoscopy are next steps if patient is                            able to tolerate a bowel prep. If he cannot                            tolerate a prep at this time, can do EGD first. Recommendation:           - Return patient to hospital ward for ongoing care.                           - Clear liquid diet until we sort out next test                           - Continue present medications.                           - EGD and colonoscopy recommended as  above, if                            patient can tolerate prep. If intolerant of prep                            today, can proceed with EGD if he is agreeable Procedure Code(s):        --- Professional ---                           [phone], Sigmoidoscopy, flexible; diagnostic,                            including collection of specimen(s) by brushing or                            washing, when performed (separate procedure) Diagnosis Code(s):        --- Professional ---                           K64.8, Other hemorrhoids                           K92.1, Melena (includes Hematochezia) CPT copyright 2019 American Medical Association. All rights reserved. The codes documented in this report  are preliminary and upon coder review may  be revised to meet current compliance requirements. Viviann Spare P. Devri Kreher, MD 12/06/2021 11:37:16 AM This report has been signed electronically. Number of Addenda: 0

## 2021-12-06 NOTE — Anesthesia Preprocedure Evaluation (Addendum)
Anesthesia Evaluation  Patient identified by MRN, date of birth, ID band Patient awake    Reviewed: Allergy & Precautions, NPO status , Patient's Chart, lab work & pertinent test results  History of Anesthesia Complications Negative for: history of anesthetic complications  Airway Mallampati: II  TM Distance: >3 FB Neck ROM: Full    Dental  (+) Dental Advisory Given   Pulmonary former smoker,    breath sounds clear to auscultation       Cardiovascular (-) angina+ DVT   Rhythm:Regular Rate:Tachycardia     Neuro/Psych negative neurological ROS     GI/Hepatic negative GI ROS, (+)     substance abuse (Methadone for pain control )  ,   Endo/Other  BMI 34  Renal/GU Renal InsufficiencyRenal disease     Musculoskeletal  (+) narcotic dependent  Abdominal   Peds  Hematology  (+) Blood dyscrasia (Hb 7.8), anemia , eliquis   Anesthesia Other Findings Metastatic melanoma  Reproductive/Obstetrics                            Anesthesia Physical Anesthesia Plan  ASA: 4  Anesthesia Plan: MAC   Post-op Pain Management: Minimal or no pain anticipated   Induction:   PONV Risk Score and Plan: 1 and Ondansetron and Treatment may vary due to age or medical condition  Airway Management Planned: Natural Airway and Simple Face Mask  Additional Equipment: None  Intra-op Plan:   Post-operative Plan:   Informed Consent: I have reviewed the patients History and Physical, chart, labs and discussed the procedure including the risks, benefits and alternatives for the proposed anesthesia with the patient or authorized representative who has indicated his/her understanding and acceptance.     Dental advisory given  Plan Discussed with: CRNA and Surgeon  Anesthesia Plan Comments: (transfuse)       Anesthesia Quick Evaluation

## 2021-12-06 NOTE — Interval H&P Note (Signed)
History and Physical Interval Note: Patient had 2 units of RBC overnight. Hgb went from 6s to 11s although he remains quite pale. Repeat Hgb on istat and in high 7s. He has not had any further bleeding overnight. Did not have any bloody output from the enemas. Flex sig done initially to evaluate, rule out radiation proctitis, etc. If this shows [place] something amenable to treat we will do so. If negative may need full colonoscopy and EGD. Otherwise mildly tachycardic, last dose of Eliquis > 24 hours ago. He wishes to proceed after discussion of risks /benefits  12/06/2021 11:14 AM  Richard Byrd.  has presented today for surgery, with the diagnosis of Hematochezia, diarrhea, and anemia.  The various methods of treatment have been discussed with the patient and family. After consideration of risks, benefits and other options for treatment, the patient has consented to  Procedure(s): FLEXIBLE SIGMOIDOSCOPY (N/A) as a surgical intervention.  The patient's history has been reviewed, patient examined, no change in status, stable for surgery.  I have reviewed the patient's chart and labs.  Questions were answered to the patient's satisfaction.     [place]

## 2021-12-06 NOTE — Progress Notes (Addendum)
Internal Medicine Attending:   I saw and examined the patient. I reviewed the resident's H&P note and I agree with the resident's findings and plan as documented in the resident's note.  In brief, patient is a 51 year old male with a past medical history of metastatic melanoma status post chemoradiation therapy who came to the ED with sudden onset of weakness and shortness of breath.  Patient woke up at about 3 AM because had to urinate but felt lightheaded and fell to the floor when he attempted to stand up.  Wife stated that patient was confused and disoriented.  EMS was called and patient brought to the ED for further evaluation.  Patient has had radiation therapy recently for lymph node in his left groin.  Status post his therapy he is noted intermittent bouts of diarrhea over the last couple of days as well as a few episodes of bright red blood in his stool which he attributes to hemorrhoids.  In the ED, patient noted to be tachycardic and hypotensive and he had a hemoglobin of 7.7 down from his baseline of 9-10.  Today, patient states that his symptoms have resolved and he feels much better. Today's Vitals   12/06/21 1210 12/06/21 1220 12/06/21 1332 12/06/21 1434  BP: (!) 141/86 134/87  110/66  Pulse: (!) 104 (!) 102  93  Resp: (!) '22 20  16  '$ Temp:  97.8 F (36.6 C)  98.8 F (37.1 C)  TempSrc:  Temporal  Oral  SpO2: 97% 100%  97%  Weight:      Height:      PainSc: 0-No pain 0-No pain Asleep    Body mass index is 34.02 kg/m.  On exam, patient is lying in bed in no apparent distress.  Cardiovascular exam reveals regular rate and rhythm with normal heart sounds.  Lungs are clear to auscultation bilaterally.  Abdomen was soft, nontender, nondistended with normoactive bowel sounds.  Patient was noted to have bilateral lower extremity swelling which is nontender to palpation.  Patient had normal mood and affect.  In brief, patient was admitted to the hospital with tachycardia and  hypotension likely secondary to multiple factors including acute symptomatic blood loss anemia as well as dehydration.  I suspect that the patient does have ongoing blood loss evidenced by his decreased hemoglobin down to 7.7 on admission as well as associated diarrhea causing dehydration.  Patient received 2 units PRBC overnight.  Patient's hemoglobin has remained in the sevens indicating ongoing blood loss (currently at 7.8).  Patient is status post flex sig with no evidence of proctitis.  We will monitor CBC daily.  GI to do a EGD and colonoscopy tomorrow for a more thorough evaluation for possible GI source of bleed.  If EGD and colonoscopy are unrevealing would consider doing a CT abdomen/pelvis to rule out an RP bleed.  Patient received 30 unit PRBC today.  We will follow posttransfusion CBC.  No further work-up at this time.  Patient was noted to be dehydrated on admission with elevated creatinine of 1.4.  This is since normalized to 0.9 after 2 L of fluid and 2 units of PRBC.  We will continue with clear liquid diet for now in anticipation of EGD/colonoscopy tomorrow.  No further work-up at this time.  Of note, patient did have an elevated ferritin of approximately 500 as well as a TSAT of 94%.  In isolated setting this would be consistent with hemochromatosis.  However, an elevated ferritin can also happen in the setting  of underlying malignancy.  This would not explain his elevated T sat of 94%.  Would consider having patient follow-up with his oncologist for further evaluation and repeating his iron studies, ferritin and transferrin saturation to rule out hemochromatosis.

## 2021-12-06 NOTE — Progress Notes (Signed)
Subjective:   Hospital day 1.  Interval events: None.  Patient was interviewed in the presence of his wife, at bedside.  Patient reports feeling better this morning with less shortness of breath compared to yesterday evening, just a slight headache probably from limited food intake. Discussed low hemoglobin and imaging results. Explained that dehydration likely caused reduced kidney function. Patient expressed understanding. He is amenable to plan for sigmoidoscopy today.  Objective:  Vital signs: Blood pressure 134/87, pulse (!) 102, temperature 97.8 F (36.6 C), temperature source Temporal, resp. rate 20, height '6\' 2"'$  (1.88 m), weight 120.2 kg, SpO2 100 %.  Supplemental O2: 3L  Physical exam: Constitutional: Pale appearing.  No apparent distress. Cardiovascular: Regular rate and rhythm. Pulmonary: Normal work of breathing.  Lungs clear to auscultation. Abdominal: Soft and nontender. Skin: Pale.  Warm and dry. Extremities: Left lower extremity swelling. Neuro: Alert.  No gross focal deficits. Psych: Pleasant.  Appropriate mood and affect.   Intake/Output Summary (Last 24 hours) at 12/06/2021 1409 Last data filed at 12/06/2021 0131 Gross per 24 hour  Intake 341.5 ml  Output --  Net 341.5 ml    Pertinent Labs:    Latest Ref Rng & Units 12/06/2021   11:04 AM 12/06/2021    3:55 AM 12/05/2021    8:57 PM  CBC  WBC 4.0 - 10.5 K/uL  3.0  6.1   Hemoglobin 13.0 - 17.0 g/dL 7.8  11.5  6.4   Hematocrit 39.0 - 52.0 % 23.0  35.4  19.9   Platelets 150 - 400 K/uL  202  335        Latest Ref Rng & Units 12/06/2021   11:04 AM 12/06/2021    3:55 AM 12/05/2021    5:34 AM  CMP  Glucose 70 - 99 mg/dL 105  92  242   BUN 6 - 20 mg/dL '14  15  12   '$ Creatinine 0.61 - 1.24 mg/dL 0.90  0.94  1.43   Sodium 135 - 145 mmol/L 137  136  138   Potassium 3.5 - 5.1 mmol/L 3.8  4.0  4.3   Chloride 98 - 111 mmol/L 107  112  110   CO2 22 - 32 mmol/L  21  12   Calcium 8.9 - 10.3  mg/dL  9.1  9.7   Total Protein 6.5 - 8.1 g/dL   5.8   Total Bilirubin 0.3 - 1.2 mg/dL   0.5   Alkaline Phos 38 - 126 U/L   114   AST 15 - 41 U/L   35   ALT 0 - 44 U/L   22    Flexible sigmoidoscopy: No signs of radiation proctitis. No signs of bleeding in the visualized rectum or colon.  Assessment/Plan:   Principal Problem:   Symptomatic anemia Active Problems:   Gastrointestinal bleeding   Metastatic melanoma ([place])   Acute kidney injury ([place])   DVT (deep venous thrombosis) ([facility])   Rectal bleeding   Patient Summary: Richard Byrd. is a 51 y.o. with a PMH of widely metastatic melanoma who has received immunotherapy and radiation, who presented with syncope and shortness of breath and was admitted for symptomatic anemia and AKI secondary to hypovolemia.   Acute symptomatic blood loss anemia Patient is status post 2 units of PRBCs.  His hemoglobin has trended from 7.9 on admission, to 6.4 overnight, now at 7.8.  His symptoms  have improved but the source of his presumed bleeding has yet to be identified.  Flexible sigmoidoscopy did not show any radiation proctitis or signs of bleeding.  Plan now is for EGD and colonoscopy tomorrow to better evaluate for GI bleeding.  May also get a CT abdomen/pelvis although index of suspicion for intra-abdominal/retroperitoneal is low given lack of clinical findings like new or worsening pain on history/exam.  Normal reticulocyte index indicates an appropriate marrow response.  Still suspicious of occult blood loss.  Low index of suspicion for hemolytic process given normal bilirubin.  Serum iron studies were collected after 2 transfusions, and are thus confounded. - GI planning for EGD and colonoscopy tomorrow. - Currently on third unit PRBCs, follow-up posttransfusion H&H - Morning CBC - Continue to transfuse for hemoglobin less than 7 or symptomatic anemia.  Prerenal AKI, resolved Patient's creatinine normalized to 0.90 after 2 L of fluids  and 2 units of PRBCs.  I suspect that significant hypovolemia in the setting of diarrhea likely exacerbated this patient's symptomatic anemia. - Continue clear liquid diet in anticipation of EGD and colonoscopy tomorrow  Pain management Due to bulky cancerous lymph nodes and bone metastases. - Dilaudid 6 mg p.o. every 4 hours - Methadone 10 mg p.o. every 8 hours  DVT of left femoral vein Per chart review, evaluated with ultrasound at [facility].  Found to be nonocclusive.  Holding anticoagulation in setting of acute blood loss. - Continue holding Eliquis for now.  Diet:  Clear liquids IVF: None VTE: None Code: Full PT/OT recs: Pending. Family Update: Spouse at bedside  Dispo: Anticipated discharge in > 1 day pending workup for acute blood loss anemia.   Nani Gasser MD 12/06/2021, 2:09 PM  Pager: [phone] After 5pm on weekdays and 1pm on weekends: On Call pager: [phone]

## 2021-12-06 NOTE — Progress Notes (Signed)
OT Cancellation Note  Patient Details Name: Richard Byrd. MRN: [id number] DOB: [birth year]   Cancelled Treatment:    Reason Eval/Treat Not Completed: Patient at procedure or test/ unavailable Patient currently off floor for GI procedure. OT will follow back as time permits.   Corinne Ports E. Shawnie Nicole, OTR/L Acute Rehabilitation Services [phone]   Ascencion Dike 12/06/2021, 11:27 AM

## 2021-12-06 NOTE — Transfer of Care (Signed)
Immediate Anesthesia Transfer of Care Note  Patient: Richard Byrd.  Procedure(s) Performed: FLEXIBLE SIGMOIDOSCOPY  Patient Location: Endoscopy Unit  Anesthesia Type:MAC  Level of Consciousness: awake and drowsy  Airway & Oxygen Therapy: Patient Spontanous Breathing  Post-op Assessment: Report given to RN, Post -op Vital signs reviewed and stable and Patient moving all extremities X 4  Post vital signs: Reviewed and stable  Last Vitals:  Vitals Value Taken Time  BP 124/77   Temp    Pulse 103   Resp 16   SpO2 96     Last Pain:  Vitals:   12/06/21 1052  TempSrc: Temporal  PainSc: 0-No pain         Complications: No notable events documented.

## 2021-12-06 NOTE — Anesthesia Postprocedure Evaluation (Signed)
Anesthesia Post Note  Patient: Richard Byrd.  Procedure(s) Performed: [place]     Patient location during evaluation: Endoscopy Anesthesia Type: MAC Level of consciousness: awake and alert, patient cooperative and oriented Pain management: pain level controlled Vital Signs Assessment: post-procedure vital signs reviewed and stable Respiratory status: nonlabored ventilation, spontaneous breathing, respiratory function stable and patient connected to nasal cannula oxygen Cardiovascular status: stable and blood pressure returned to baseline Postop Assessment: no apparent nausea or vomiting Anesthetic complications: no Comments: transfusing   No notable events documented.  Last Vitals:  Vitals:   12/06/21 1210 12/06/21 1220  BP: (!) 141/86 134/87  Pulse: (!) 104 (!) 102  Resp: (!) 22 20  Temp:  36.6 C  SpO2: 97% 100%    Last Pain:  Vitals:   12/06/21 1220  TempSrc: Temporal  PainSc: 0-No pain                 Abbigaile Rockman,E. Sanyia Dini

## 2021-12-06 NOTE — Progress Notes (Addendum)
PT Cancellation Note  Patient Details Name: Richard Byrd. MRN: [id number] DOB: 21-Dec-1970   Cancelled Treatment:    Reason Eval/Treat Not Completed: Patient at procedure or test/unavailable Will follow up as schedule allows.   1535: Checked on pt second time and pt sleeping soundly after procedure. Will follow up as schedule allows.   Lou Miner, DPT  Acute Rehabilitation Services  Office: [phone]  Rudean Hitt 12/06/2021, 11:00 AM

## 2021-12-06 NOTE — H&P (Signed)
Date: 12/05/2021              Patient Name:  Richard Byrd. MRN: [id number]  DOB: 1970/11/08 Age / Sex: 51 y.o., male   PCP: Pcp, No         Medical Service: Internal Medicine Teaching Service         Attending Physician: Dr. Aldine Contes    First Contact: Dr. Carin Primrose Pager: [phone]  Second Contact: Dr. Alfonse Spruce Pager: [phone]       After Hours (After 5p/  First Contact Pager: [phone]  weekends / holidays): Second Contact Pager: [phone]   Chief Complaint: Shortness of breath and weakness  History of Present Illness: This patient is a 51 year old male with a history of metastatic melanoma status post chemoradiation who came to the emergency department by ambulance because of sudden onset weakness and shortness of breath.  He describes waking up around 3 AM because he had to urinate.  This was not unusual.  However when he sat up, he felt very woozy, and promptly fell to the floor.  He did not lose consciousness, but his wife describes a period of confusion and disorientation.  When he attempted to sit up on the ground, he became very short of breath, describing the sensation as "like I got the wind knocked out of me."  This prompted a call to EMS for transport to the emergency department for evaluation.  Patient's recent history is notable for malaise which he attributes to radiation therapy for a cancerous lymph node in his groin.  He has had numerous rounds of radiation and always feels kind of "beat up" afterwards.  He also reports numerous bouts of diarrhea over the last few days, correlating with the timing of his radiation therapy.  Prior to the onset of his diarrhea he had a hemorrhoid that frequently caused some bright red blood in his stool.  At the time of this interview, the patient is feeling more comfortable than he was at onset of his symptoms last night.  His dyspnea has improved.  Review of systems is notable for headache, increased pallor.  The patient  denies chest pain, abdominal pain, new or worsening back pain, focal weakness or sensory changes, change in frequency of urination, difficulty urinating, or dysuria.  Upon arrival to the ED the patient was tachycardic and hypotensive.  EKG showed sinus tachycardia.  CTA of the chest was negative for pulmonary embolism.  Laboratory work-up was notable for hemoglobin of 7.7.  Physical exam was notable for guaiac positive brown stool.  He was treated with IV fluids with some improvement in his symptoms.  Meds: Held overnight pain medicine and morning dose of Eliquis because of acute illness.  Current Meds  Medication Sig   Cholecalciferol (VITAMIN D3) 1.25 MG (50000 UT) CAPS Take 1 capsule by mouth once a week. Sundays   colchicine 0.6 MG tablet Take 0.'6mg'$  (one tablet) by mouth every 1-2 hours until one of the following occurs: 1.  The pain is gone 2.  The maximum dose has been given ( no more than 3 tabs in 3 hours or 10 tabs in 24 hours) 3.  The side effects outweight the benefits   ELIQUIS 5 MG TABS tablet Take 5 mg by mouth 2 (two) times daily.   HYDROmorphone (DILAUDID) 4 MG tablet Take 6 mg by mouth every 4 (four) hours.   lactulose (CHRONULAC) 10 GM/15ML solution Take 10 g by mouth daily as needed for mild  constipation.   LORazepam (ATIVAN) 1 MG tablet Take 1 mg by mouth at bedtime as needed for anxiety.   methadone (DOLOPHINE) 5 MG tablet Take 7.5 mg by mouth daily.   OLANZapine (ZYPREXA) 5 MG tablet Take 5 mg by mouth at bedtime.   ondansetron (ZOFRAN-ODT) 8 MG disintegrating tablet Take 8 mg by mouth every 8 (eight) hours as needed for nausea/vomiting.   pregabalin (LYRICA) 75 MG capsule Take 75 mg by mouth every 12 (twelve) hours.     Allergies: Allergies as of 12/05/2021 - Review Complete 12/05/2021  Allergen Reaction Noted   Tramadol Nausea And Vomiting 02/28/2012   Vicodin [hydrocodone-acetaminophen] Nausea Only 04/18/2011   Past Medical History:  Diagnosis Date   Deviated  septum    Hyperlipidemia    Melanoma ([place])    Obese    Observation for suspected cardiovascular disease    Other dyspnea and respiratory abnormality    Sleep disturbance, unspecified     Family History: Family history of heart disease in father and grandfather.  Social History: Lives at home with wife and children.  Denies alcohol, tobacco, or drug use.  Review of Systems: A complete ROS was negative except as per HPI.   Physical Exam: Blood pressure 110/66, pulse 93, temperature 98.8 F (37.1 C), temperature source Oral, resp. rate 16, height '6\' 2"'$  (1.88 m), weight 120.2 kg, SpO2 97 %. General: Pale appearing male in no apparent acute distress. HEENT: Anicteric sclera. Cardiovascular: Regular tachycardia.  No murmurs. Respiratory: Normal work of breathing.  Lungs clear to auscultation. Abdominal: Soft.  Nontender.  Nondistended. Extremities: Left lower extremity edema. Skin: Pale.  Warm and dry. Neuro: Alert.  No gross focal deficits.  Equal strength bilateral upper and lower extremities. Psych: Pleasant.  Appropriate mood and affect.  EKG: Sinus tachycardia.  CXR: Nodular density behind the heart.  Otherwise no pulmonary edema or consolidation.  CTA chest: No evidence of pulmonary embolism.  Widespread metastatic disease to lungs, pleura, right hilar and mediastinal lymph nodes, and liver.  Sclerotic lesion with mild pathologic compression at T11 and small sclerotic lesion in T3.  Labs: Serum creatinine of 1.43 mg/dL  WBC 14.1 Hemoglobin 7.7 MCV 92  Urine specific gravity 1.045  Assessment & Plan  Richard Byrd. is a 51 y.o. with a history of widely metastatic melanoma who is status post a recent course of palliative radiation therapy who presents with symptomatic anemia and syncope likely due to a combination of gastrointestinal bleeding and diarrhea causing hypovolemia.  Principal Problem:   Acute blood loss anemia Active Problems:   Gastrointestinal  bleeding   Metastatic melanoma ([facility])   DVT (deep venous thrombosis) ([facility])  Symptomatic anemia Gastrointestinal bleeding Hemoglobin of 7.7 down from 9-10 11/25/2021.  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  Granted, the tempo of his symptoms do not necessarily match subacute GI hemorrhage, but I think his condition is compounded by diarrhea and hypovolemia.  He has probably been developing anemia for several days and his concomitant fluid losses have combined to cause the clinical syndrome with which he presents to the hospital.  Other causes of the patient's symptoms could be pure hypovolemia.  Low index of suspicion for ACS as the patient is without chest pain or EKG  changes and had symptoms that were dependent on position rather than purely at rest or with exertion.  Suspect mild troponin elevation secondary to severe anemia and tachycardia rather than ACS.  PE has been ruled out via negative CTA chest.  The patient's clinical picture is not suggestive of an infection like pneumonia or viral upper respiratory tract infection. - Request GI consult for evaluation of a presumed lower GI bleed. - Follow-up iron studies and reticulocytes. - Transfuse for hemoglobin less than 7 or for symptoms.  AKI Hypovolemia This patient's history of worsening symptoms dependant on positional changes in the setting of diarrhea that is been ongoing for several days since a course of palliative radiation therapy is strongly suggestive of hypovolemia.  Suspect his AKI is of a prerenal etiology.  This may also be the origin of his anion gap metabolic acidosis.  Initially hypotensive and tachycardic.  Creatinine of 1.43, up from a baseline of around 0.8.  Urinalysis was bland, but with  elevated specific gravity.  His symptoms, heart rate, and blood pressure had improved somewhat by the time he was evaluated by the IMTS, at which point he was status post 2 L of IV fluids.  Renal ultrasound obtained to rule out hydronephrosis given history of external compression on ureter from bulky tumor. - Continue IV fluids at 125 mL/h through the evening. - Follow-up lactic acid level. - Patient may eat and drink, but switch to clear liquids at midnight in anticipation of colonoscopy.  DVT Left lower extremity, likely secondary to bulky, cancerous lymph node compressing on the veins and hypercoagulability of malignancy.  Currently holding Eliquis in setting of presumed GI bleed.  Hypercalcemia of malignancy Calcium 9.7 on admission.  Gets Zometa every 3 months.  Pain management Due to bulky cancerous lymph nodes and bone metastases. - Dilaudid 6 mg p.o. every 4 hours - Methadone 10 mg p.o. every 8 hours  Diet:  Clear liquids and NPO after midnight IVF: NS,125cc/hr VTE:  Contraindicated in setting of presumed GI bleeding Code: Full Surrogate: Audi, [place], [phone]  Dispo: Admit patient to Inpatient with expected length of stay greater than 2 midnights.  Signed: Nani Gasser MD 12/05/21, 3:11 PM Pager: [phone] After 5pm on weekdays and 1pm on weekends: On Call pager: [phone]

## 2021-12-06 NOTE — Anesthesia Procedure Notes (Signed)
Procedure Name: MAC Date/Time: 12/06/2021 11:20 AM  Performed by: Harden Mo, CRNAPre-anesthesia Checklist: Patient identified, Emergency Drugs available, Suction available and Patient being monitored Patient Re-evaluated:Patient Re-evaluated prior to induction Oxygen Delivery Method: Simple face mask Preoxygenation: Pre-oxygenation with 100% oxygen Induction Type: IV induction Placement Confirmation: positive ETCO2 and breath sounds checked- equal and bilateral Dental Injury: Teeth and Oropharynx as per pre-operative assessment

## 2021-12-07 ENCOUNTER — Encounter (HOSPITAL_COMMUNITY): Payer: Self-pay | Admitting: Internal Medicine

## 2021-12-07 ENCOUNTER — Inpatient Hospital Stay (HOSPITAL_COMMUNITY): Payer: Medicaid Other

## 2021-12-07 ENCOUNTER — Inpatient Hospital Stay (HOSPITAL_COMMUNITY): Payer: Medicaid Other | Admitting: Critical Care Medicine

## 2021-12-07 ENCOUNTER — Encounter (HOSPITAL_COMMUNITY)
Admission: EM | Disposition: A | Discharge status: Home / Self Care | Payer: Self-pay | Source: Home / Self Care | Attending: Internal Medicine

## 2021-12-07 DIAGNOSIS — K253 Acute gastric ulcer without hemorrhage or perforation: Secondary | ICD-10-CM

## 2021-12-07 DIAGNOSIS — K209 Esophagitis, unspecified without bleeding: Secondary | ICD-10-CM

## 2021-12-07 DIAGNOSIS — K5520 Angiodysplasia of colon without hemorrhage: Secondary | ICD-10-CM

## 2021-12-07 DIAGNOSIS — K520 Gastroenteritis and colitis due to radiation: Secondary | ICD-10-CM

## 2021-12-07 DIAGNOSIS — D649 Anemia, unspecified: Secondary | ICD-10-CM

## 2021-12-07 DIAGNOSIS — Z87891 Personal history of nicotine dependence: Secondary | ICD-10-CM

## 2021-12-07 DIAGNOSIS — K259 Gastric ulcer, unspecified as acute or chronic, without hemorrhage or perforation: Secondary | ICD-10-CM

## 2021-12-07 DIAGNOSIS — K2100 Gastro-esophageal reflux disease with esophagitis, without bleeding: Secondary | ICD-10-CM

## 2021-12-07 DIAGNOSIS — K602 Anal fissure, unspecified: Secondary | ICD-10-CM

## 2021-12-07 DIAGNOSIS — K2970 Gastritis, unspecified, without bleeding: Secondary | ICD-10-CM

## 2021-12-07 DIAGNOSIS — D500 Iron deficiency anemia secondary to blood loss (chronic): Secondary | ICD-10-CM | POA: Diagnosis not present

## 2021-12-07 DIAGNOSIS — R195 Other fecal abnormalities: Secondary | ICD-10-CM

## 2021-12-07 HISTORY — PX: COLONOSCOPY WITH PROPOFOL: SHX5780

## 2021-12-07 HISTORY — PX: BIOPSY: SHX5522

## 2021-12-07 HISTORY — PX: HOT HEMOSTASIS: SHX5433

## 2021-12-07 HISTORY — PX: ESOPHAGOGASTRODUODENOSCOPY (EGD) WITH PROPOFOL: SHX5813

## 2021-12-07 LAB — BPAM RBC
Blood Product Expiration Date: 202310282359
Blood Product Expiration Date: 202310282359
Blood Product Expiration Date: 202310292359
ISSUE DATE / TIME: 202310080939
ISSUE DATE / TIME: 202310082210
ISSUE DATE / TIME: 202310091151
Unit Type and Rh: 6200
Unit Type and Rh: 6200
Unit Type and Rh: 6200

## 2021-12-07 LAB — CBC
HCT: 22.8 % — ABNORMAL LOW (ref 39.0–52.0)
HCT: 23.7 % — ABNORMAL LOW (ref 39.0–52.0)
Hemoglobin: 7.2 g/dL — ABNORMAL LOW (ref 13.0–17.0)
Hemoglobin: 7.8 g/dL — ABNORMAL LOW (ref 13.0–17.0)
MCH: 27.3 pg (ref 26.0–34.0)
MCH: 28.3 pg (ref 26.0–34.0)
MCHC: 31.6 g/dL (ref 30.0–36.0)
MCHC: 32.9 g/dL (ref 30.0–36.0)
MCV: 86.4 fL (ref 80.0–100.0)
MCV: 85.9 fL (ref 80.0–100.0)
Platelets: 292 10*3/uL (ref 150–400)
Platelets: 322 10*3/uL (ref 150–400)
RBC: 2.64 MIL/uL — ABNORMAL LOW (ref 4.22–5.81)
RBC: 2.76 MIL/uL — ABNORMAL LOW (ref 4.22–5.81)
RDW: 16.8 % — ABNORMAL HIGH (ref 11.5–15.5)
RDW: 17.2 % — ABNORMAL HIGH (ref 11.5–15.5)
WBC: 5.8 10*3/uL (ref 4.0–10.5)
WBC: 6.3 10*3/uL (ref 4.0–10.5)
nRBC: 0 % (ref 0.0–0.2)
nRBC: 0.5 % — ABNORMAL HIGH (ref 0.0–0.2)

## 2021-12-07 LAB — BASIC METABOLIC PANEL
Anion gap: 8 (ref 5–15)
BUN: 9 mg/dL (ref 6–20)
CO2: 21 mmol/L — ABNORMAL LOW (ref 22–32)
Calcium: 9.5 mg/dL (ref 8.9–10.3)
Chloride: 108 mmol/L (ref 98–111)
Creatinine, Ser: 0.79 mg/dL (ref 0.61–1.24)
GFR, Estimated: >60 mL/min (ref >60)
Glucose, Bld: 87 mg/dL (ref 70–99)
Potassium: 3.9 mmol/L (ref 3.5–5.1)
Sodium: 137 mmol/L (ref 135–145)

## 2021-12-07 LAB — TYPE AND SCREEN
ABO/RH(D): A POS
Antibody Screen: NEGATIVE
Unit division: 0
Unit division: 0
Unit division: 0

## 2021-12-07 SURGERY — ESOPHAGOGASTRODUODENOSCOPY (EGD) WITH PROPOFOL
Anesthesia: Monitor Anesthesia Care

## 2021-12-07 MED ORDER — LACTATED RINGERS IV SOLN: INTRAVENOUS | Status: DC | PRN

## 2021-12-07 MED ORDER — PHENYLEPHRINE HCL-NACL 20-0.9 MG/250ML-% IV SOLN
INTRAVENOUS | Status: DC | PRN
  Administered 2021-12-07: 50 ug/min via INTRAVENOUS

## 2021-12-07 MED ORDER — PROPOFOL 10 MG/ML IV BOLUS
INTRAVENOUS | Status: DC | PRN
  Administered 2021-12-07 (×4): 10 mg via INTRAVENOUS

## 2021-12-07 MED ORDER — PHENYLEPHRINE 80 MCG/ML (10ML) SYRINGE FOR IV PUSH (FOR BLOOD PRESSURE SUPPORT)
PREFILLED_SYRINGE | INTRAVENOUS | Status: DC | PRN
  Administered 2021-12-07 (×3): 80 ug via INTRAVENOUS
  Administered 2021-12-07: 160 ug via INTRAVENOUS
  Administered 2021-12-07 (×2): 80 ug via INTRAVENOUS
  Administered 2021-12-07: 160 ug via INTRAVENOUS

## 2021-12-07 MED ORDER — ADULT MULTIVITAMIN W/MINERALS CH
1.0000 | ORAL_TABLET | Freq: Every day | ORAL | Status: DC
  Administered 2021-12-07 – 2021-12-08 (×2): 1 via ORAL
  Filled 2021-12-07 (×2): qty 1

## 2021-12-07 MED ORDER — PANTOPRAZOLE SODIUM 40 MG PO TBEC
40.0000 mg | DELAYED_RELEASE_TABLET | Freq: Two times a day (BID) | ORAL | Status: DC
  Administered 2021-12-07 – 2021-12-08 (×3): 40 mg via ORAL
  Filled 2021-12-07 (×3): qty 1

## 2021-12-07 MED ORDER — PROPOFOL 500 MG/50ML IV EMUL
INTRAVENOUS | Status: DC | PRN
  Administered 2021-12-07: 150 ug/kg/min via INTRAVENOUS
  Administered 2021-12-07: 200 ug/kg/min via INTRAVENOUS

## 2021-12-07 MED ORDER — EPHEDRINE SULFATE-NACL 50-0.9 MG/10ML-% IV SOSY
PREFILLED_SYRINGE | INTRAVENOUS | Status: DC | PRN
  Administered 2021-12-07 (×2): 5 mg via INTRAVENOUS

## 2021-12-07 MED ORDER — ENSURE ENLIVE PO LIQD
237.0000 mL | Freq: Two times a day (BID) | ORAL | Status: DC
  Administered 2021-12-08: 237 mL via ORAL

## 2021-12-07 SURGICAL SUPPLY — 25 items

## 2021-12-07 NOTE — Op Note (Signed)
[facility] Patient Name: Richard Byrd Procedure Date : 12/07/2021 MRN: [id number] Attending MD: Willaim Rayas. Adela Lank , MD Date of Birth: 1971-02-27 CSN: [id number] Age: 51 Admit Type: Inpatient Procedure:                Upper GI endoscopy Indications:              anemia suspected due to GI tract loss - on Eliquis,                            held for 2 days. History of metastatic melanoma.                            Patient has occasional bright red blood per rectum,                            denies melena or dark stools Providers:                Viviann Spare P. Adela Lank, MD, Vicki Mallet, RN, Harrington Challenger, Technician Referring MD:              Medicines:                Monitored Anesthesia Care Complications:            No immediate complications. Estimated blood loss:                            Minimal. Estimated Blood Loss:     Estimated blood loss was minimal. Procedure:                Pre-Anesthesia Assessment:                           - Prior to the procedure, a History and Physical                            was performed, and patient medications and                            allergies were reviewed. The patient's tolerance of                            previous anesthesia was also reviewed. The risks                            and benefits of the procedure and the sedation                            options and risks were discussed with the patient.                            All questions were answered, and informed consent  was obtained. Prior Anticoagulants: The patient has                            taken Eliquis (apixaban), last dose was 2 days                            prior to procedure. ASA Grade Assessment: III - A                            patient with severe systemic disease. After                            reviewing the risks and benefits, the patient was                            deemed in  satisfactory condition to undergo the                            procedure.                           After obtaining informed consent, the endoscope was                            passed under direct vision. Throughout the                            procedure, the patient's blood pressure, pulse, and                            oxygen saturations were monitored continuously. The                            [id number]-[id number] ([id number]) Olympus endoscope was introduced                            through the mouth, and advanced to the second part                            of duodenum. The upper GI endoscopy was                            accomplished without difficulty. The patient                            tolerated the procedure well. Scope In: Scope Out: Findings:      Esophagogastric landmarks were identified: the Z-line was found at 40       cm, the gastroesophageal junction was found at 40 cm and the upper       extent of the gastric folds was found at 41 cm from the incisors.      A 1 cm hiatal hernia was present.      LA Grade A esophagitis was found at the GEJ, with associated nodularity,  likely inflammatory in nature. Biopsies were taken with a cold forceps       for histology to ensure no dysplastic change.      The exam of the esophagus was otherwise normal.      One non-bleeding cratered gastric ulcer with a clean ulcer base (Forrest       Class III) was found in the gastric antrum, in close approximation to       the pylorus. The lesion was 3 mm in largest dimension. Associated       inflammatory changes / nodularity vs. polypoid changes were noted in the       area. Biopsies were taken of this area at the periphery of the ulcer       with a cold forceps for histology.      The exam of the stomach was otherwise normal.      Biopsies were taken with a cold forceps in the gastric body, at the       incisura and in the gastric antrum for Helicobacter pylori testing.      The  examined duodenum was normal. Impression:               - Esophagogastric landmarks identified.                           - 1 cm hiatal hernia.                           - LA Grade A reflux esophagitis with likely                            inflammatory nodularity. Biopsied.                           - Non-bleeding gastric ulcer with a clean ulcer                            base (Forrest Class III) with associated likely                            inflammatory nodularity. Biopsied.                           - Normal stomach otherwise - biopsies taken to rule                            out H pylori                           - Normal examined duodenum.                           Gastric ulcer and esophagitis noted, but mild,                            would not be expected to cause this level of                            anemia, no clinical  history for upper GI bleeding. Recommendation:           - Return patient to hospital ward for ongoing care.                           - Advance diet as tolerated.                           - Continue present medications.                           - Start oral protonix 40mg  twice daily                           - Continue to hold Eliquis for now - see                            colonoscopy note                           - Await pathology results.                           - Further recommendations per colonscopy note Procedure Code(s):        --- Professional ---                           [phone], Esophagogastroduodenoscopy, flexible,                            transoral; with biopsy, single or multiple Diagnosis Code(s):        --- Professional ---                           K44.9, Diaphragmatic hernia without obstruction or                            gangrene                           K21.00, Gastro-esophageal reflux disease with                            esophagitis, without bleeding                           K25.9, Gastric ulcer, unspecified as acute  or                            chronic, without hemorrhage or perforation                           D50.0, Iron deficiency anemia secondary to blood                            loss (chronic) CPT copyright 2019 American Medical Association. All rights reserved. The codes documented in this report are preliminary and upon coder  review may  be revised to meet current compliance requirements. Viviann Spare P. Briell Paulette, MD 12/07/2021 10:44:45 AM This report has been signed electronically. Number of Addenda: 0

## 2021-12-07 NOTE — Progress Notes (Signed)
Initial Nutrition Assessment  DOCUMENTATION CODES:   Not applicable  INTERVENTION:  - Add Ensure Enlive po BID, each supplement provides 350 kcal and 20 grams of protein.  -Add MVI q day   NUTRITION DIAGNOSIS:   Inadequate oral intake related to decreased appetite, poor appetite as evidenced by energy intake < 75% for > or equal to 1 month.  GOAL:   Patient will meet greater than or equal to 90% of their needs  MONITOR:   PO intake, Supplement acceptance  REASON FOR ASSESSMENT:   Consult Assessment of nutrition requirement/status  ASSESSMENT:   51 y.o. male admits related to SOB and weakness. PMH includes: HLD and melanoma.  Meds reviewed. Labs reviewed.   Pt reports that he is hungry and want to eat and today was the first day he has been able to eat since admission. Pt states that he did not have an appetite for about the past month. Pt states that he has been undergoing radiation. The pt agrees to try Ensure BID.   NUTRITION - FOCUSED PHYSICAL EXAM:  Flowsheet Row Most Recent Value  Orbital Region No depletion  Upper Arm Region No depletion  Thoracic and Lumbar Region No depletion  Buccal Region No depletion  Temple Region No depletion  Clavicle Bone Region No depletion  Clavicle and Acromion Bone Region No depletion  Scapular Bone Region No depletion  Dorsal Hand No depletion  Patellar Region No depletion  Anterior Thigh Region No depletion  Posterior Calf Region No depletion  Edema (RD Assessment) Mild  Hair Reviewed  Eyes Reviewed  Mouth Reviewed  Skin Reviewed  Nails Reviewed       Diet Order:   Diet Order             Diet regular Room service appropriate? Yes; Fluid consistency: Thin  Diet effective now                   EDUCATION NEEDS:   Not appropriate for education at this time  Skin:  Skin Assessment: Reviewed RN Assessment  Last BM:  12/06/21  Height:   Ht Readings from Last 1 Encounters:  12/06/21 '6\' 2"'$  (1.88 m)     Weight:   Wt Readings from Last 1 Encounters:  12/07/21 117.3 kg    Ideal Body Weight:  86.4 kg  BMI:  Body mass index is 33.2 kg/m.  Estimated Nutritional Needs:   Kcal:  3818-2993 kcals  Protein:  105-130 gm  Fluid:  7169-6789 mL  Thalia Bloodgood, RD, LDN, CNSC

## 2021-12-07 NOTE — Progress Notes (Addendum)
Subjective:   Hospital day 2.  Interval events: EGD showed small nonbleeding gastric ulcer.  Colonoscopy showed colitis in the hepatic flexure, treated with APC.  Presumed source of bleeding.  Symptoms continue to improve. Tolerated colonoscopy well. Had numerous bowel movements associated with colonoscopy prep but without blood in stool. Hopeful for discharge in the next day or so.  Objective:  Vital signs: Blood pressure (!) 130/92, pulse 100, temperature 99.8 F (37.7 C), resp. rate 14, height '6\' 2"'$  (1.88 m), weight 117.3 kg, SpO2 100 %.  Physical exam: Constitutional: Pale appearing.  No apparent distress. Cardiovascular: Regular rate and rhythm. Pulmonary: Normal work of breathing.  Lungs clear to auscultation. Abdominal: Soft and nontender. Musculoskeletal:  Skin: Pale.  Warm and dry. Extremities: Left lower extremity swelling. Neuro: Alert.  No gross focal deficits. Psych: Pleasant.  Appropriate mood and affect.   Intake/Output Summary (Last 24 hours) at 12/07/2021 1115 Last data filed at 12/07/2021 1022 Gross per 24 hour  Intake 870 ml  Output --  Net 870 ml    Pertinent Labs:    Latest Ref Rng & Units 12/07/2021    5:58 AM 12/06/2021   10:19 PM 12/06/2021    4:29 PM  CBC  WBC 4.0 - 10.5 K/uL 5.8  8.3    Hemoglobin 13.0 - 17.0 g/dL 7.2  8.7  7.4   Hematocrit 39.0 - 52.0 % 22.8  26.1  22.8   Platelets 150 - 400 K/uL 292  371         Latest Ref Rng & Units 12/07/2021    5:58 AM 12/06/2021   11:04 AM 12/06/2021    3:55 AM  CMP  Glucose 70 - 99 mg/dL 87  105  92   BUN 6 - 20 mg/dL '9  14  15   '$ Creatinine 0.61 - 1.24 mg/dL 0.79  0.90  0.94   Sodium 135 - 145 mmol/L 137  137  136   Potassium 3.5 - 5.1 mmol/L 3.9  3.8  4.0   Chloride 98 - 111 mmol/L 108  107  112   CO2 22 - 32 mmol/L 21   21   Calcium 8.9 - 10.3 mg/dL 9.5   9.1    Assessment/Plan:   Principal Problem:   Acute blood loss anemia Active Problems:   Gastrointestinal  bleeding   Metastatic melanoma (HCC)   DVT (deep venous thrombosis) (HCC)   Guaiac positive stools   Radiation colitis   Acute gastric ulcer   Patient Summary: Richard Deremer. is a 51 y.o. 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   Acute blood loss anemia Status post 3 units of PRBCs.  Hemoglobin 7.2 this morning, down from 7.4 posttransfusion yesterday.  His colonoscopy showed a segment of colon near the hepatic flexure with signs of radiation colitis.  This was treated with APC.  However given the absence of bleeding per rectum during this admission, it is possible that there is bleeding elsewhere.  Thus, we will order CT abdomen pelvis to evaluate for potential intra-abdominal or retroperitoneal bleed in this patient with widespread melanoma metastases.  We will continue to trend hemoglobin and support with  transfusion as necessary, but hoping for stabilization of this patient's hemoglobin today. - Transfuse for hemoglobin less than 7 or symptomatic anemia - Morning CBC - Follow-up CT abdomen/pelvis  Nonbleeding ulcer in the gastric antrum Mild esophagitis No signs of bleeding from the esophagus or the gastric ulcer, thus not likely the source of this patient's acute blood loss anemia.  Tissue biopsies were obtained from both sites to evaluate for dysplasia and H. pylori. - Start pantoprazole 40 mg p.o. twice daily - Follow-up tissue biopsies  Elevated transferrin saturation Elevated ferritin Iron studies showed a transferrin saturation of 94% and ferritin of approximately 500.  These were obtained after the patient had received blood, which confounds these studies to an uncertain degree.  Ferritin may also be elevated in the setting  of active malignancy.  At follow-up he should have repeat iron studies.  Prerenal AKI, resolved Creatinine remains at baseline today. - Resume regular diet  Pain management Due to bulky cancerous lymph nodes and bone metastases. - Dilaudid 6 mg p.o. every 4 hours - Methadone 10 mg p.o. every 8 hours   DVT of left femoral vein Per chart review, evaluated with ultrasound at [facility].  Found to be nonocclusive.  Holding anticoagulation in setting of acute blood loss. - Continue holding Eliquis for now.  Diet: Normal IVF: None VTE:  Held in setting of acute blood loss anemia Code: Full PT/OT recs: Pending. Family Update: Family at beside  Dispo: Anticipated discharge to Home in 2 days pending workup and treatment of acute blood loss anemia.   Nani Gasser MD 12/07/2021, 11:15 AM  Pager: [phone] After 5pm on weekdays and 1pm on weekends: On Call pager: [phone]

## 2021-12-07 NOTE — Progress Notes (Signed)
OT Cancellation Note  Patient Details Name: Richard Byrd. MRN: [id number] DOB: 1970/10/05   Cancelled Treatment:    Reason Eval/Treat Not Completed: Patient at procedure or test/ unavailable  Joeseph Amor OTR/L  Acute Rehab Services  [phone] office number [phone] pager number  Joeseph Amor 12/07/2021, 9:21 AM

## 2021-12-07 NOTE — Transfer of Care (Signed)
Immediate Anesthesia Transfer of Care Note  Patient: Richard Byrd.  Procedure(s) Performed: ESOPHAGOGASTRODUODENOSCOPY (EGD) WITH PROPOFOL COLONOSCOPY WITH PROPOFOL BIOPSY HOT HEMOSTASIS (ARGON PLASMA COAGULATION/BICAP)  Patient Location: PACU  Anesthesia Type:MAC  Level of Consciousness: awake, alert  and oriented  Airway & Oxygen Therapy: Patient Spontanous Breathing  Post-op Assessment: Report given to RN, Post -op Vital signs reviewed and stable and Patient moving all extremities  Post vital signs: Reviewed and stable  Last Vitals:  Vitals Value Taken Time  BP 91/70 12/07/21 1042  Temp    Pulse 101 12/07/21 1044  Resp    SpO2 98 % 12/07/21 1044  Vitals shown include unvalidated device data.  Last Pain:  Vitals:   12/07/21 0904  TempSrc: Tympanic  PainSc: 0-No pain      Patients Stated Pain Goal: 0 (20/72/18 2883)  Complications: No notable events documented.

## 2021-12-07 NOTE — Op Note (Signed)
[facility] Patient Name: Richard Byrd Procedure Date : 12/07/2021 MRN: [id number] Attending MD: Carlota Raspberry. Havery Moros , MD Date of Birth: [birth year] CSN: [id number] Age: 51 Admit Type: Inpatient Procedure:                Colonoscopy Indications:              intermittent rectal bleeding, anemia, on Eliquis                            (held for 2 days), history of metastatic melanoma                            with history of radiation treatment Providers:                Remo Lipps P. Havery Moros, MD, Grace Isaac, RN, Cletis Athens, Technician Referring MD:              Medicines:                Monitored Anesthesia Care Complications:            No immediate complications. Estimated blood loss:                            Minimal. Estimated Blood Loss:     Estimated blood loss was minimal. Procedure:                Pre-Anesthesia Assessment:                           - Prior to the procedure, a History and Physical                            was performed, and patient medications and                            allergies were reviewed. The patient's tolerance of                            previous anesthesia was also reviewed. The risks                            and benefits of the procedure and the sedation                            options and risks were discussed with the patient.                            All questions were answered, and informed consent                            was obtained. Prior Anticoagulants: The patient has  taken Eliquis (apixaban), last dose was 2 days                            prior to procedure. ASA Grade Assessment: III - A                            patient with severe systemic disease. After                            reviewing the risks and benefits, the patient was                            deemed in satisfactory condition to undergo the                            procedure.                            After obtaining informed consent, the colonoscope                            was passed under direct vision. Throughout the                            procedure, the patient's blood pressure, pulse, and                            oxygen saturations were monitored continuously. The                            [id number] ([id number]) Olympus coloscope was                            introduced through the anus and advanced to the the                            cecum, identified by the ileocecal valve. The                            colonoscopy was technically difficult and complex                            due to a redundant colon and significant looping.                            The patient tolerated the procedure well. The                            quality of the bowel preparation was good. The                            ileocecal valve and the rectum were photographed. Scope In: 9:57:55 AM Scope Out: 10:30:22 AM Scope Withdrawal Time: 0 hours 17 minutes 18 seconds  Total  Procedure Duration: 0 hours 32 minutes 27 seconds  Findings:      An anal fissure was found on perianal exam which was new compared to       recent flex sig (likely developed with bowel prep for colonoscopy).      Multiple small patchy angioectasias were found localized at the hepatic       flexure grossly consistent with radiation colitis. Fulguration to ablate       the larger / higher risk lesions to prevent bleeding by argon plasma was       successful. Several of these lesions were friable.      The colon was long / redundant, revealed excessive looping. Positional       changes were utilized and manual pressure applied to the abdominal wall       to help achieve cecal intubation. I could get the colonoscope up to the       IC valve and see into the cecum which appeared normal, but could not       intubate the cecal cap itself or achieve ileal intubation given the       looping. The patient has a  large abdomen and could not get good pressure       for a stable position despite 2 technicians applying pressure.      A few small-mouthed diverticula were found in the descending colon and       transverse colon.      Internal hemorrhoids were found. The hemorrhoids were small.      The exam was otherwise without abnormality. Retroflexed views of the       rectum not obtained given small size of the rectum. Impression:               - Anal fissure found on perianal exam, likely due                            to bowel prep as not seen on recent flex sig.                           - Multiple localized angioectasias in the hepatic                            flexure grossly consistent with radiation changes.                            Treated with argon plasma coagulation (APC) with a                            good result.                           - There was significant looping of the long /                            redundant colon. Could not clear cecal cap or                            intubate the ileum as outlined but no obvious  pathology noted                           - Mild diverticulosis in the descending colon and                            in the transverse colon.                           - Internal hemorrhoids.                           Gastric ulcer noted on EGD, but suspect radiation                            changes in the colon may be more likely to be the                            cause of the patient's anemia / symptoms, several                            were friable. Gastric ulcer had no high risk                            pathology for bleeding, he has not had any melena,                            but will treat both and await his course Recommendation:           - Return patient to hospital ward for ongoing care.                           - Advance diet as tolerated.                           - Continue present medications.                            - Continue to hold Eliquis for now (he has not                            responded appropriately to 3 units PRBC so far (Hgb                            6.24 to 7.2)                           - start oral Protonix '40mg'$  twice daily                           - Trend Hgb, monitor for recurrent bleeding symptoms                           - Once outpatient, topical diltiazem or  nitroglycerin ointment to treat anal fissure Procedure Code(s):        --- Professional ---                           [phone], Colonoscopy, flexible; with control of                            bleeding, any method Diagnosis Code(s):        --- Professional ---                           K60.2, Anal fissure, unspecified                           K55.20, Angiodysplasia of colon without hemorrhage                           K64.8, Other hemorrhoids                           K92.1, Melena (includes Hematochezia)                           D50.0, Iron deficiency anemia secondary to blood                            loss (chronic)                           K57.30, Diverticulosis of large intestine without                            perforation or abscess without bleeding CPT copyright 2019 American Medical Association. All rights reserved. The codes documented in this report are preliminary and upon coder review may  be revised to meet current compliance requirements. Remo Lipps P. Havery Moros, MD 12/07/2021 10:58:57 AM This report has been signed electronically. Number of Addenda: 0

## 2021-12-07 NOTE — Progress Notes (Signed)
PT Cancellation Note  Patient Details Name: Richard Byrd. MRN: [id number] DOB: [birth year]   Cancelled Treatment:    Reason Eval/Treat Not Completed: Patient at procedure or test/unavailable   Wyona Almas, PT, DPT Acute Rehabilitation Services Office [place] 12/07/2021, 9:33 AM

## 2021-12-07 NOTE — Progress Notes (Signed)
OT Cancellation Note  Patient Details Name: Richard Byrd. MRN: [id number] DOB: [birth year]   Cancelled Treatment:    Reason Eval/Treat Not Completed: Patient not medically ready (Per nursing to hold at this time.)  Joeseph Amor OTR/L  Acute Rehab Services  [phone] office number [phone] pager number  Joeseph Amor 12/07/2021, 12:22 PM

## 2021-12-07 NOTE — Anesthesia Preprocedure Evaluation (Signed)
Anesthesia Evaluation  Patient identified by MRN, date of birth, ID band Patient awake    Reviewed: Allergy & Precautions, H&P , NPO status , Patient's Chart, lab work & pertinent test results  Airway Mallampati: II   Neck ROM: full    Dental   Pulmonary former smoker,    breath sounds clear to auscultation       Cardiovascular negative cardio ROS   Rhythm:regular Rate:Normal     Neuro/Psych    GI/Hepatic   Endo/Other    Renal/GU      Musculoskeletal   Abdominal   Peds  Hematology  (+) Blood dyscrasia, anemia ,   Anesthesia Other Findings   Reproductive/Obstetrics                             Anesthesia Physical Anesthesia Plan  ASA: 2  Anesthesia Plan: MAC   Post-op Pain Management:    Induction: Intravenous  PONV Risk Score and Plan: 1 and Propofol infusion and Treatment may vary due to age or medical condition  Airway Management Planned: Nasal Cannula  Additional Equipment:   Intra-op Plan:   Post-operative Plan:   Informed Consent: I have reviewed the patients History and Physical, chart, labs and discussed the procedure including the risks, benefits and alternatives for the proposed anesthesia with the patient or authorized representative who has indicated his/her understanding and acceptance.     Dental advisory given  Plan Discussed with: CRNA, Anesthesiologist and Surgeon  Anesthesia Plan Comments:         Anesthesia Quick Evaluation

## 2021-12-07 NOTE — Interval H&P Note (Signed)
History and Physical Interval Note: Patient had a negative flex sig yesterday. Prepped overnight for EGD and colonoscopy to further evaluate. He has passed brown stool, no blood. 3 units PRBC given, Hgb stable in 7s but has not increased as would normally expect. Otherwise feels at baseline. Denies cardiopulmonary symptoms. Now at least 2 days out from Eliquis use. I have discussed risks / benefits of EGD and colonoscopy with him and he wishes to proceed. Further recommendations pending the results.   12/07/2021 9:39 AM  Richard Byrd.  has presented today for surgery, with the diagnosis of anemia, heme positive stool.  The various methods of treatment have been discussed with the patient and family. After consideration of risks, benefits and other options for treatment, the patient has consented to  Procedure(s): ESOPHAGOGASTRODUODENOSCOPY (EGD) WITH PROPOFOL (N/A) COLONOSCOPY WITH PROPOFOL (N/A) as a surgical intervention.  The patient's history has been reviewed, patient examined, no change in status, stable for surgery.  I have reviewed the patient's chart and labs.  Questions were answered to the patient's satisfaction.     [place]

## 2021-12-08 DIAGNOSIS — K625 Hemorrhage of anus and rectum: Secondary | ICD-10-CM

## 2021-12-08 DIAGNOSIS — K520 Gastroenteritis and colitis due to radiation: Principal | ICD-10-CM

## 2021-12-08 DIAGNOSIS — D62 Acute posthemorrhagic anemia: Secondary | ICD-10-CM

## 2021-12-08 LAB — CBC
HCT: 22.8 % — ABNORMAL LOW (ref 39.0–52.0)
HCT: 23 % — ABNORMAL LOW (ref 39.0–52.0)
HCT: 23.7 % — ABNORMAL LOW (ref 39.0–52.0)
Hemoglobin: 7.5 g/dL — ABNORMAL LOW (ref 13.0–17.0)
Hemoglobin: 7.4 g/dL — ABNORMAL LOW (ref 13.0–17.0)
Hemoglobin: 7.4 g/dL — ABNORMAL LOW (ref 13.0–17.0)
MCH: 28.4 pg (ref 26.0–34.0)
MCH: 27.7 pg (ref 26.0–34.0)
MCH: 27.6 pg (ref 26.0–34.0)
MCHC: 32.9 g/dL (ref 30.0–36.0)
MCHC: 32.2 g/dL (ref 30.0–36.0)
MCHC: 31.2 g/dL (ref 30.0–36.0)
MCV: 86.4 fL (ref 80.0–100.0)
MCV: 86.1 fL (ref 80.0–100.0)
MCV: 88.4 fL (ref 80.0–100.0)
Platelets: 291 10*3/uL (ref 150–400)
Platelets: 311 10*3/uL (ref 150–400)
Platelets: 295 10*3/uL (ref 150–400)
RBC: 2.64 MIL/uL — ABNORMAL LOW (ref 4.22–5.81)
RBC: 2.67 MIL/uL — ABNORMAL LOW (ref 4.22–5.81)
RBC: 2.68 MIL/uL — ABNORMAL LOW (ref 4.22–5.81)
RDW: 17.2 % — ABNORMAL HIGH (ref 11.5–15.5)
RDW: 17.3 % — ABNORMAL HIGH (ref 11.5–15.5)
RDW: 17.5 % — ABNORMAL HIGH (ref 11.5–15.5)
WBC: 5.8 10*3/uL (ref 4.0–10.5)
WBC: 5.5 10*3/uL (ref 4.0–10.5)
WBC: 5.1 10*3/uL (ref 4.0–10.5)
nRBC: 0.9 % — ABNORMAL HIGH (ref 0.0–0.2)
nRBC: 0.4 % — ABNORMAL HIGH (ref 0.0–0.2)
nRBC: 0 % (ref 0.0–0.2)

## 2021-12-08 LAB — SURGICAL PATHOLOGY

## 2021-12-08 MED ORDER — METHADONE HCL 5 MG PO TABS: 10.0000 mg | ORAL_TABLET | Freq: Three times a day (TID) | ORAL | 0 refills | Status: AC

## 2021-12-08 MED ORDER — PANTOPRAZOLE SODIUM 40 MG PO TBEC: 40.0000 mg | DELAYED_RELEASE_TABLET | Freq: Two times a day (BID) | ORAL | 1 refills | Status: AC

## 2021-12-08 NOTE — Progress Notes (Signed)
OT Cancellation Note  Patient Details Name: Richard Byrd. MRN: [id number] DOB: 07-Oct-1970   Cancelled Treatment:    Reason Eval/Treat Not Completed: Patient declined, no reason specified Patient politely declining OT services in the hospital, stating he was just at [facility] and had completed an OT/PT evaluation, and does not have any need for OT follow up at home. OT explaining role to patient and patient's wife, with both in understanding, however declining OT evaluation at this time. Patient and wife endorse they have all equipment needed at home. OT will sign off at this time, please re-consult if acute OT needs arise.   Corinne Ports E. Johnny Latu, OTR/L Acute Rehabilitation Services [phone]   Ascencion Dike 12/08/2021, 9:52 AM

## 2021-12-08 NOTE — Discharge Summary (Signed)
Name: Richard Byrd. MRN: [id number] DOB: 1970/06/28 51 y.o. PCP: Pcp, No  Date of Admission: 12/05/2021  5:25 AM Date of Discharge: 12/08/2021 6:46 PM Attending Physician: Aldine Contes, MD  Discharge Diagnosis: Principal Problem:   Acute blood loss anemia Active Problems:   Gastrointestinal bleeding   Metastatic melanoma ([place])   DVT (deep venous thrombosis) (HCC)   Guaiac positive stools   Radiation colitis   Acute gastric ulcer   Discharge Medications: Allergies as of 12/08/2021       Reactions   Tramadol Nausea And Vomiting   Vicodin [hydrocodone-acetaminophen] Nausea Only   Hot Flashes        Medication List     TAKE these medications    colchicine 0.6 MG tablet Take 0.'6mg'$  (one tablet) by mouth every 1-2 hours until one of the following occurs: 1.  The pain is gone 2.  The maximum dose has been given ( no more than 3 tabs in 3 hours or 10 tabs in 24 hours) 3.  The side effects outweight the benefits   Eliquis 5 MG Tabs tablet Generic drug: apixaban Take 5 mg by mouth 2 (two) times daily.   HYDROmorphone 4 MG tablet Commonly known as: DILAUDID Take 6 mg by mouth every 4 (four) hours.   lactulose 10 GM/15ML solution Commonly known as: CHRONULAC Take 10 g by mouth daily as needed for mild constipation.   LORazepam 1 MG tablet Commonly known as: ATIVAN Take 1 mg by mouth at bedtime as needed for anxiety.   methadone 5 MG tablet Commonly known as: DOLOPHINE Take 2 tablets (10 mg total) by mouth every 8 (eight) hours. What changed:  how much to take when to take this   OLANZapine 5 MG tablet Commonly known as: ZYPREXA Take 5 mg by mouth at bedtime.   ondansetron 8 MG disintegrating tablet Commonly known as: ZOFRAN-ODT Take 8 mg by mouth every 8 (eight) hours as needed for nausea/vomiting.   OPDIVO IV Inject into the vein.   pantoprazole 40 MG tablet Commonly known as: PROTONIX Take 1 tablet (40 mg total) by mouth 2 (two) times  daily.   pregabalin 75 MG capsule Commonly known as: LYRICA Take 75 mg by mouth every 12 (twelve) hours.   Vitamin D3 1.25 MG (50000 UT) Caps Take 1 capsule by mouth once a week. Sundays        Disposition and follow-up:   [name]. is a 51 y.o. 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 They were discharged from [facility] on hospital day 3 in Stable condition.  At the hospital follow up visit please address:  Acute blood loss anemia Thought to be due to radiation colitis.  This was evaluated and treated with argon plasma coagulation to do a colonoscopy.  Patient received 3 units PRBCs while he was admitted.  Hemoglobin was stable for a day prior to discharge. - Patient was instructed to hold his Eliquis, to be resumed on December 09, 2021 - Please repeat CBC at next visit  Small, nonbleeding gastric ulcer Esophagitis Incidentally found to have a small nonbleeding gastric antral ulcer.  Not thought to be the source of his bleeding.  Biopsies did not show H. pylori or dysplasia.  Also found to have esophagitis.  Consistent with Barrett's esophagus on biopsy.  He was  started on pantoprazole 40 mg twice daily.  AKI due to hypovolemia In the setting of acute blood loss anemia and diarrhea, causing hypovolemia in the days leading up to his hospitalization.  Responded well to fluid bolus.  Discharged with baseline creatinine.  CT abdomen/pelvis findings CT abdomen pelvis obtained to rule out retroperitoneal bleed or intraperitoneal free fluid. When compared to CT abdomen pelvis obtained at [facility] on 11/16/21, noted interval enlargement of hepatic tumors. Also with interval development of simple free fluid in right hemi-abdomen extending to right paracolic gutter.  Labs / imaging needed at time of  follow-up: Consider repeat CT abdomen/pelvis for monitoring of intraperitoneal fluid collection.  Follow-up Appointments:  Follow-up Information     Raynelle Chary, April, MD Follow up.   Specialty: Internal Medicine Contact information: [facility] [place] [address] [phone]                 Hospital Course by problem list: Acute blood loss anemia Initially presented with syncope and shortness of breath exacerbated by upright.  PE and ACS ruled out.  He was found to have a hemoglobin of 7.7, down from approximately 9.7 a couple of weeks prior to admission.  He had no obvious signs of bleeding except for some bright red blood per rectum thought to be due to hemorrhoids.  He also had a recent history of diarrhea after radiation treatment for melanoma metastases.  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  This was treated with argon plasma coagulation.  Patient received 3 units of packed red blood cells and his hemoglobin remained stable but did not improve much.  Notably, patient did not have any bloody bowel movements while he was hospitalized.  Thus, CT abdomen pelvis was also obtained to rule out retroperitoneal bleeding or intraperitoneal bleeding in the setting of numerous metastatic melanoma tumors, with one large one on the kidney.  This showed interval enlargement of hepatic metastases and interval development of simple appearing fluid in the right hemiabdomen, but nothing consistent with obvious blood collections.  The patient was discharged after couple of days in stable condition.  Prerenal AKI due to hypovolemia Presented hypotensive, tachycardic, and with AKI.  History is notable for diarrhea that coincided with recent radiation therapy for metastatic melanoma.  He had rapid symptomatic improvement after IV fluid bolus and his AKI resolved by hospital day 2.  Discharge Exam:  Subjective: Feeling better day by  day.  No new or worsening shortness of breath, chest pain, lightheadedness.  No diarrhea or blood with stool.  Eager to go home.   Blood pressure 133/69, pulse 78, temperature 98.5 F (36.9 C), temperature source Oral, resp. rate 16, height '6\' 2"'$  (1.88 m), weight 117.1 kg, SpO2 96 %. Constitutional: Pale appearing.  No apparent distress. Cardiovascular: Regular rate and rhythm. Pulmonary: Normal work of breathing.  Lungs clear to auscultation. Abdominal: Soft and nontender. Musculoskeletal: No bruising on back or flanks. Skin: Pale.  Warm and dry. Extremities: Left lower extremity swelling. Neuro: Alert.  No gross focal deficits. Psych: Pleasant.  Appropriate mood and affect.  Pertinent studies and procedures:   Latest Reference Range & Units 12/05/21 05:34  Sodium 135 - 145 mmol/L 138  Potassium 3.5 - 5.1 mmol/L 4.3  Chloride 98 - 111 mmol/L 110  CO2 22 - 32 mmol/L 12 (L)  Glucose 70 - 99 mg/dL 242 (H)  BUN 6 - 20 mg/dL 12  Creatinine 0.61 - 1.24 mg/dL 1.43 (  H)  Calcium 8.9 - 10.3 mg/dL 9.7  Anion gap 5 - 15  16 (H)  Alkaline Phosphatase 38 - 126 U/L 114  Albumin 3.5 - 5.0 g/dL 2.3 (L)  AST 15 - 41 U/L 35  ALT 0 - 44 U/L 22  Total Protein 6.5 - 8.1 g/dL 5.8 (L)  Total Bilirubin 0.3 - 1.2 mg/dL 0.5  GFR, Estimated >60 mL/min >60  (L): Data is abnormally low (H): Data is abnormally high   Latest Reference Range & Units 12/08/21 13:16  WBC 4.0 - 10.5 K/uL 5.5  RBC 4.22 - 5.81 MIL/uL 2.67 (L)  Hemoglobin 13.0 - 17.0 g/dL 7.4 (L)  HCT 39.0 - 52.0 % 23.0 (L)  MCV 80.0 - 100.0 fL 86.1  MCH 26.0 - 34.0 pg 27.7  MCHC 30.0 - 36.0 g/dL 32.2  RDW 11.5 - 15.5 % 17.3 (H)  Platelets 150 - 400 K/uL 311  nRBC 0.0 - 0.2 % 0.4 (H)  (L): Data is abnormally low (H): Data is abnormally high  CT abdomen/pelvis: 1. Wall thickening of the right ascending colon, finding can be seen in the setting of colitis. 2. Multiple irregular lesions seen throughout the liver, compatible with  metastatic disease. 3. Large left pelvic mass which is likely due to conglomerate lymph nodes. 4. Enlarged mesenteric lymph node. 5. Large cystic lesion of the right upper quadrant, likely arising from the right adrenal gland. 6. Ascites of the right hemiabdomen and small right pleural effusion. 7. Enlarged mediastinal and hilar lymph nodes and bilateral pulmonary nodules, unchanged when compared with recent prior chest CT.  Discharge Instructions:   Discharge Instructions      [name].  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 You were treated with blood transfusions and fluids. You also received a colonoscopy during which the doctor used a tool called argon plasma coagulation to stop the bleeding from your inflamed colon. It's also possible that some of your blood loss occurred due to bleeding from metastatic tumors in your liver. I suspect your symptoms were exacerbated by dehydration caused by diarrhea in the days leading up to your hospitalization. During your workup you were also found to have a small, non-bleeding gastric ulcer. We are discharging you home now that you are doing better. To help assist you on your road to recovery, I have written the following recommendations:   Because of your acute blood loss anemia, please stop taking your Eliquis for now.  You may resume this medication tomorrow, October 12. This will allow time for your colon to heal from the procedure.  For the ulcer that was discovered in your stomach, please start taking pantoprazole (Protonix), 1 tablet (40 mg) twice daily.  Please follow up with your oncologist, Dr. Raynelle Chary, as you are able. The information from this hospitalization, including your lab values and the radiologist's impression of your CT scan, should be available to them via their  electronic health record.  For your convenience, I have included the radiologist's impression of your CT scan below:  CT abdomen/pelvis: 1. Wall thickening of the right ascending colon, finding can be seen in the setting of colitis. 2. Multiple irregular lesions seen throughout the liver, compatible with metastatic disease. 3. Large left pelvic mass which is likely due to conglomerate lymph nodes. 4. Enlarged mesenteric lymph node. 5. Large cystic lesion of  the right upper quadrant, likely arising from the right adrenal gland. 6. Ascites of the right hemiabdomen and small right pleural effusion. 7. Enlarged mediastinal and hilar lymph nodes and bilateral pulmonary nodules, unchanged when compared with recent prior chest CT.  It was a privilege to be a part of your hospital care team, and I hope you feel better as a result of your stay.  All the best, Nani Gasser, MD     Signed: Nani Gasser MD 12/08/2021, 6:46 PM   Pager: [phone]

## 2021-12-08 NOTE — Evaluation (Signed)
Physical Therapy Evaluation Patient Details Name: Richard Byrd. MRN: [id number] DOB: [birth year] Today's Date: 12/08/2021  History of Present Illness  Patient is a 51 yo male presenting to ED with SOB and generalized weakness on 12/05/21. Found to be tachycardic, elevated troponin, CT of lungs clear of PE, and anemic with 1 units of PRBCs and admitted same day.  Pt s/p 10/9 sigmoidoscopy . PMH of metastatic melanoma currrently undergoing radiation, and prior DVT on Eliquis.  Clinical Impression   Pt admitted with above diagnosis. Lives at home with family, in a single -level home with 3 steps to enter; Prior to admission, pt was able to walk household distances with RW, manage basic ADLs, and wife assist as needed; Presents to PT with generalized weakness, but still moving well, and Orthostatic BPs negative for a drop with standing; got up to EOB independently,  mingaurd assist for safety with sit<>stand during orthostatics; poslitely declined amb, however has options for wheelchair to get around if he is feeling weak at home; Pt currently with functional limitations due to the deficits listed below (see PT Problem List). Pt will benefit from skilled PT to increase their independence and safety with mobility to allow discharge to the venue listed below.           12/08/21 1100  Orthostatic Lying   BP- Lying 128/67 (MAP 85)  Pulse- Lying 89  Orthostatic Sitting  BP- Sitting (!) 128/91 (MAP 101)  Pulse- Sitting 107  Orthostatic Standing at 0 minutes  BP- Standing at 0 minutes 125/89 (MAP 97)  Pulse- Standing at 0 minutes 121  Orthostatic Standing at 3 minutes  BP- Standing at 3 minutes 130/73 (MAP 93)  Pulse- Standing at 3 minutes 119      Recommendations for follow up therapy are one component of a multi-disciplinary discharge planning process, led by the attending physician.  Recommendations may be updated based on patient status, additional functional criteria and insurance  authorization.  Follow Up Recommendations No PT follow up      Assistance Recommended at Discharge PRN  Patient can return home with the following  A little help with walking and/or transfers;Assistance with cooking/housework;Help with stairs or ramp for entrance    Equipment Recommendations None recommended by PT  Recommendations for Other Services       Functional Status Assessment Patient has had a recent decline in their functional status and demonstrates the ability to make significant improvements in function in a reasonable and predictable amount of time.     Precautions / Restrictions Precautions Precautions: Fall      Mobility  Bed Mobility Overal bed mobility: Modified Independent                  Transfers Overall transfer level: Needs assistance Equipment used: Rolling walker (2 wheels) Transfers: Sit to/from Stand Sit to Stand: Supervision           General transfer comment: Good hand placement; Supervision for safety    Ambulation/Gait               General Gait Details: Politely declined ambulation today  Stairs            Wheelchair Mobility    Modified Rankin (Stroke Patients Only)       Balance Overall balance assessment: Needs assistance   Sitting balance-Leahy Scale: Fair (approaching Good)       Standing balance-Leahy Scale: Fair Standing balance comment: Able to let go of RW without balance difficulty,  but seems to prefer UE support in standing                             Pertinent Vitals/Pain Pain Assessment Pain Assessment: No/denies pain    Home Living Family/patient expects to be discharged to:: Private residence Living Arrangements: Spouse/significant other;Children Available Help at Discharge: Family Type of Home: House Home Access: Stairs to enter Entrance Stairs-Rails: Psychiatric nurse of Steps: 3   Home Layout: One level Home Equipment: Conservation officer, nature (2  wheels);Rollator (4 wheels);Cane - single point;BSC/3in1;Wheelchair Hotel manager - power Additional Comments: More information about bathroom setup could be useful, however pt and wife indicate they are very well epuipped, and do not anticipate any needs    Prior Function Prior Level of Function : Independent/Modified Independent             Mobility Comments: Using RW       Hand Dominance        Extremity/Trunk Assessment   Upper Extremity Assessment Upper Extremity Assessment: Generalized weakness    Lower Extremity Assessment Lower Extremity Assessment: Generalized weakness       Communication   Communication: No difficulties  Cognition Arousal/Alertness: Awake/alert Behavior During Therapy: WFL for tasks assessed/performed Overall Cognitive Status: Within Functional Limits for tasks assessed                                          General Comments General comments (skin integrity, edema, etc.): Pt tells me he has recently been through PT course during his hospital stay at [facility], and is hesitant to participate here; very focused on doing what is needed to get home asap    Exercises     Assessment/Plan    PT Assessment Patient needs continued PT services  PT Problem List Decreased strength;Decreased activity tolerance;Decreased balance;Decreased mobility;Cardiopulmonary status limiting activity       PT Treatment Interventions DME instruction;Gait training;Stair training;Functional mobility training;Therapeutic activities;Therapeutic exercise;Patient/family education    PT Goals (Current goals can be found in the Care Plan section)  Acute Rehab PT Goals Patient Stated Goal: Home asap PT Goal Formulation: With patient Time For Goal Achievement: 12/22/21 Potential to Achieve Goals: Good    Frequency Min 3X/week     Co-evaluation               AM-PAC PT "6 Clicks" Mobility  Outcome Measure Help needed turning from your  back to your side while in a flat bed without using bedrails?: None Help needed moving from lying on your back to sitting on the side of a flat bed without using bedrails?: None Help needed moving to and from a bed to a chair (including a wheelchair)?: None Help needed standing up from a chair using your arms (e.g., wheelchair or bedside chair)?: A Little Help needed to walk in hospital room?: A Little Help needed climbing 3-5 steps with a railing? : A Lot 6 Click Score: 20    End of Session   Activity Tolerance: Patient tolerated treatment well Patient left: in bed;with call bell/phone within reach;with family/visitor present Nurse Communication: Mobility status;Other (comment) (and orthostatics done) PT Visit Diagnosis: Muscle weakness (generalized) (M62.81);History of falling (Z91.81)    Time: 9449-6759 PT Time Calculation (min) (ACUTE ONLY): 24 min   Charges:   PT Evaluation $PT Eval Low Complexity: 1 Low PT Treatments $  Therapeutic Activity: 8-22 mins        Roney Marion, PT  Acute Rehabilitation Services Office [phone]   [place] 12/08/2021, 3:03 PM

## 2021-12-08 NOTE — Progress Notes (Signed)
Daily Rounding Note  12/08/2021, 1:51 PM  LOS: 3 days   SUBJECTIVE:   Chief complaint:    Hematochezia.  No further rectal bleeding.  Has had 1 brown stool since colonoscopy yesterday.  Tolerating solid diet.  Feels well.  OBJECTIVE:         Vital signs in last 24 hours:    Temp:  [98.5 F (36.9 C)-100 F (37.8 C)] 98.5 F (36.9 C) (10/11 0809) Pulse Rate:  [79-110] 95 (10/11 0809) Resp:  [16-18] 16 (10/11 0809) BP: (100-127)/(53-73) 123/54 (10/11 0809) SpO2:  [95 %-97 %] 96 % (10/11 0809) Weight:  [117.1 kg] 117.1 kg (10/11 0500) Last BM Date : 12/07/21 Filed Weights   12/06/21 1052 12/07/21 0500 12/08/21 0500  Weight: 120.2 kg 117.3 kg 117.1 kg   General: NAD, comfortable, pale. Heart: RRR. Chest: No labored breathing, no cough. Abdomen: Soft without tenderness.  Obese.  Active bowel sounds Extremities: No CCE. Neuro/Psych: Oriented x3.  Moves all 4 limbs.  No tremors or deficits.  Intake/Output from previous day: 10/10 0701 - 10/11 0700 In: 1460 [P.O.:960; I.V.:500] Out: 1350 [Urine:1350]  Intake/Output this shift: No intake/output data recorded.  Lab Results: Recent Labs    12/07/21 1625 12/08/21 0619 12/08/21 1316  WBC 6.3 5.8 5.5  HGB 7.8* 7.5* 7.4*  HCT 23.7* 22.8* 23.0*  PLT 322 291 311   BMET Recent Labs    12/06/21 0355 12/06/21 1104 12/07/21 0558  NA 136 137 137  K 4.0 3.8 3.9  CL 112* 107 108  CO2 21*  --  21*  GLUCOSE 92 105* 87  BUN '15 14 9  '$ CREATININE 0.94 0.90 0.79  CALCIUM 9.1  --  9.5   LFT No results for input(s): "PROT", "ALBUMIN", "AST", "ALT", "ALKPHOS", "BILITOT", "BILIDIR", "IBILI" in the last 72 hours. PT/INR No results for input(s): "LABPROT", "INR" in the last 72 hours. Hepatitis Panel No results for input(s): "HEPBSAG", "HCVAB", "HEPAIGM", "HEPBIGM" in the last 72 hours.  Studies/Results: CT ABDOMEN PELVIS WO CONTRAST  Result Date:  12/07/2021 CLINICAL DATA:  Concern for retroperitoneal bleed; * Tracking Code: BO * EXAM: CT ABDOMEN AND PELVIS WITHOUT CONTRAST TECHNIQUE: Multidetector CT imaging of the abdomen and pelvis was performed following the standard protocol without IV contrast. RADIATION DOSE REDUCTION: This exam was performed according to the departmental dose-optimization program which includes automated exposure control, adjustment of the mA and/or kV according to patient size and/or use of iterative reconstruction technique. COMPARISON:  Chest CT dated December 05, 2021 FINDINGS: Lower chest: Normal heart size. Small right pleural effusion. Enlarged mediastinal and hilar lymph nodes bilateral pulmonary nodules, unchanged when compared with recent prior chest CT. Hepatobiliary: Irregular lesions are seen throughout the liver. Reference lesion of the left lobe of the liver measuring 4.6 x 4.4 cm on series 3, image 30 central areas of hyperdensity are seen, possibly due to prior hemorrhage. Reference subcapsular lesion of the right lobe of the liver, measuring proximally 8.7 x 5.5 cm image 22. Gallbladder is unremarkable. No biliary ductal dilation. Pancreas: Fatty atrophy of the pancreas. Spleen: Normal in size without focal abnormality. Adrenals/Urinary Tract: Large cystic lesion of the right upper quadrant measuring approximately 14.5 x 2.0 cm, likely arising from the right adrenal gland. Normal right adrenal gland is not visualized. Left adrenal gland is unremarkable. No hydronephrosis. Small bilateral nonobstructing renal stones. Stomach/Bowel: Stomach is within normal limits. Wall thickening of the right ascending colon. Normal appendix. No evidence  of obstruction. Vascular/Lymphatic: Aortic atherosclerosis. Enlarged mesenteric lymph node measuring 3.6 x 2.5 cm on series 3, image 69. Large left pelvic mass which is likely due to conglomerate adenopathy, largest component measures 13.0 x 12.0 cm on series 5, image 98 and causes  mass effect on the adjacent urinary bladder. Extension into the left inguinal canal and anterior thigh. Reproductive: Prostate is unremarkable. Other: Simple appearing right perihepatic fluid which extends inferiorly into the right pericolic gutter. No evidence of retroperitoneal hematoma. Musculoskeletal: Multiple lytic and sclerotic lesions, including the bilateral iliac wings, left pubic symphysis and multiple thoracic and lumbar spine vertebral bodies. Vertebroplasty changes seen at T10, T11, T12 and L2. IMPRESSION: 1. Wall thickening of the right ascending colon, finding can be seen in the setting of colitis. 2. Multiple irregular lesions seen throughout the liver, compatible with metastatic disease. 3. Large left pelvic mass which is likely due to conglomerate lymph nodes. 4. Enlarged mesenteric lymph node. 5. Large cystic lesion of the right upper quadrant, likely arising from the right adrenal gland. 6. Ascites of the right hemiabdomen and small right pleural effusion. 7. Enlarged mediastinal and hilar lymph nodes and bilateral pulmonary nodules, unchanged when compared with recent prior chest CT. Electronically Signed   By: Yetta Glassman M.D.   On: 12/07/2021 19:57    ASSESMENT:   Intermittent rectal bleeding 12/06/21 Flex sig: Nonbleeding internal hemorrhoids.  Normal rectum, rectosigmoid, sigmoid, distal descending colon. 12/07/2021 colonoscopy with anal fissure (new from recent flex sig), multiple angioectasias without bleeding at the hepatic flexure most consistent with radiation related changes.  Area treated with APC.  Significant redundant colon with looping, MD unable to clear cecal cap or intubate ileum.  Mild descending, transverse diverticulosis.  Nonbleeding internal hemorrhoids.  Radiation changes seen are most likely source of anemia and bleeding. 12/07/2021 EGD: 1 cm HH.  Mild reflux esophagitis, biopsied.  Nonbleeding, clean-based gastric ulcer with associated inflammatory  nodularity, biopsied.  Otherwise normal stomach and examined duodenum.  Pathology showed foveolar epithelium with hyperplastic and reactive/preoperative changes, mild/chronic inactive gastritis and chemical/reactive changes, no H. pylori.  Anemia.  Hgb 6.4.. 11.5.. 7.4.  3 PRBCs so far.  Normal iron, elevated iron sats, ferritin 503.  Low TIBC.  B12 normal  Malignant melanoma.  Initial lesion was on upper lateral left thigh.  Underwent radiation therapy with one of the targets being abdominal lymph node.   PLAN   GI will sign off.  Advised wife and Richard Byrd that if he has recurrent significant bleeding that he should contact our office or if he is significantly bleeding and symptomatic he should go to the ER.  I will clear it with Dr. Havery Moros whether he wants to follow-up Mr. Pangilinan in the office or have him seen as needed for recurrent bleeding    Azucena Freed  12/08/2021, 1:51 PM Phone [phone]

## 2021-12-08 NOTE — Discharge Instructions (Addendum)
[name].  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 You were treated with blood transfusions and fluids. You also received a colonoscopy during which the doctor used a tool called argon plasma coagulation to stop the bleeding from your inflamed colon. It's also possible that some of your blood loss occurred due to bleeding from metastatic tumors in your liver. I suspect your symptoms were exacerbated by dehydration caused by diarrhea in the days leading up to your hospitalization. During your workup you were also found to have a small, non-bleeding gastric ulcer. We are discharging you home now that you are doing better. To help assist you on your road to recovery, I have written the following recommendations:   Because of your acute blood loss anemia, please stop taking your Eliquis for now.  You may resume this medication tomorrow, October 12. This will allow time for your colon to heal from the procedure.  For the ulcer that was discovered in your stomach, please start taking pantoprazole (Protonix), 1 tablet (40 mg) twice daily.  Please follow up with your oncologist, Dr. Raynelle Chary, as you are able. The information from this hospitalization, including your lab values and the radiologist's impression of your CT scan, should be available to them via their electronic health record.  For your convenience, I have included the radiologist's impression of your CT scan below:  CT abdomen/pelvis: 1. Wall thickening of the right ascending colon, finding can be seen in the setting of colitis. 2. Multiple irregular lesions seen throughout the liver, compatible with metastatic disease. 3. Large left pelvic mass which is likely due to conglomerate lymph nodes. 4. Enlarged mesenteric lymph node. 5. Large cystic lesion of the right upper quadrant,  likely arising from the right adrenal gland. 6. Ascites of the right hemiabdomen and small right pleural effusion. 7. Enlarged mediastinal and hilar lymph nodes and bilateral pulmonary nodules, unchanged when compared with recent prior chest CT.  It was a privilege to be a part of your hospital care team, and I hope you feel better as a result of your stay.  All the best, Nani Gasser, MD

## 2021-12-09 ENCOUNTER — Encounter (HOSPITAL_COMMUNITY): Payer: Self-pay | Admitting: Gastroenterology

## 2021-12-09 ENCOUNTER — Telehealth: Payer: Self-pay

## 2021-12-09 DIAGNOSIS — D62 Acute posthemorrhagic anemia: Secondary | ICD-10-CM

## 2021-12-09 MED ORDER — AMBULATORY NON FORMULARY MEDICATION: 0 refills | Status: AC

## 2021-12-09 NOTE — Telephone Encounter (Signed)
-----   Message from Yetta Flock, MD sent at 12/08/2021  5:30 PM EDT ----- Regarding: outpatient follow up [facility] can you please order CBC for this patient to be checked at our lab on Monday. Can you also please order diltiazem/lidocaine ointment, pea-sized applied PR 3 times daily for a few weeks for anal fissure. Can you also help book him a routine office visit for me or APP in the next 4 to 6 weeks or so.  Thanks

## 2021-12-09 NOTE — Anesthesia Postprocedure Evaluation (Signed)
Anesthesia Post Note  Patient: Eloise Mula.  Procedure(s) Performed: ESOPHAGOGASTRODUODENOSCOPY (EGD) WITH PROPOFOL COLONOSCOPY WITH PROPOFOL BIOPSY HOT HEMOSTASIS (ARGON PLASMA COAGULATION/BICAP)     Patient location during evaluation: Endoscopy Anesthesia Type: MAC Level of consciousness: awake and alert Pain management: pain level controlled Vital Signs Assessment: post-procedure vital signs reviewed and stable Respiratory status: spontaneous breathing, nonlabored ventilation, respiratory function stable and patient connected to nasal cannula oxygen Cardiovascular status: stable and blood pressure returned to baseline Postop Assessment: no apparent nausea or vomiting Anesthetic complications: no   No notable events documented.  Last Vitals:  Vitals:   12/08/21 0809 12/08/21 1557  BP: (!) 123/54 133/69  Pulse: 95 78  Resp: 16 16  Temp: 36.9 C 36.9 C  SpO2: 96% 96%    Last Pain:  Vitals:   12/08/21 1557  TempSrc: Oral  PainSc:                  [place]

## 2021-12-09 NOTE — Telephone Encounter (Signed)
Called and spoke with patient regarding next steps in his care. Pt knows to stop by the lab at our office on Monday. Pt knows to expect a call from [facility] once rectal ointment is ready for pick up. Pt has been scheduled for a follow up appt with Alonza Bogus, PA-C on Thursday, 01/13/22 at 10 am. Pt is aware that I will mail his appt information to him, he confirmed address on file. Patient had no concerns at the end of the call.   Lab order and reminder in epic.  [place] and spoke with pharmacist Jerene Pitch. I gave her a verbal order for Diltiazem/Lidocaine ointment.

## 2021-12-15 ENCOUNTER — Encounter: Payer: Self-pay | Admitting: Gastroenterology

## 2021-12-17 ENCOUNTER — Other Ambulatory Visit: Payer: Self-pay

## 2021-12-17 NOTE — Telephone Encounter (Signed)
Not a Clinic patient.  His insurance is requesting a 90 day supply.  Patient has not scheduled a follow appointment.  Has 1 refill of 30 left.

## 2021-12-17 NOTE — Telephone Encounter (Signed)
Call to Pharmacy.  Refill of the Pantoprazole 40 mg #90 refused.  Patient will need to call and establish as patient prior to further refills.

## 2022-01-13 ENCOUNTER — Ambulatory Visit: Payer: Medicaid Other | Admitting: Gastroenterology

## 2022-02-01 ENCOUNTER — Ambulatory Visit: Payer: Medicaid Other | Admitting: Nurse Practitioner

## 2022-02-12 ENCOUNTER — Other Ambulatory Visit: Payer: Self-pay
